# Patient Record
Sex: Female | Born: 1950 | Race: Black or African American | Hispanic: No | Smoking: Never smoker
Health system: Southern US, Community
[De-identification: ages and names within clinical notes are randomized; demographics above are authoritative.]

## PROBLEM LIST (undated history)

## (undated) DIAGNOSIS — T7840XA Allergy, unspecified, initial encounter: Secondary | ICD-10-CM

## (undated) DIAGNOSIS — M109 Gout, unspecified: Secondary | ICD-10-CM

## (undated) DIAGNOSIS — K7689 Other specified diseases of liver: Secondary | ICD-10-CM

## (undated) DIAGNOSIS — I10 Essential (primary) hypertension: Secondary | ICD-10-CM

## (undated) DIAGNOSIS — D3A00 Benign carcinoid tumor of unspecified site: Secondary | ICD-10-CM

## (undated) DIAGNOSIS — N6009 Solitary cyst of unspecified breast: Secondary | ICD-10-CM

## (undated) DIAGNOSIS — E785 Hyperlipidemia, unspecified: Secondary | ICD-10-CM

## (undated) DIAGNOSIS — M1990 Unspecified osteoarthritis, unspecified site: Secondary | ICD-10-CM

## (undated) DIAGNOSIS — C801 Malignant (primary) neoplasm, unspecified: Secondary | ICD-10-CM

## (undated) DIAGNOSIS — N281 Cyst of kidney, acquired: Secondary | ICD-10-CM

## (undated) HISTORY — PX: PARATHYROIDECTOMY: SHX19

## (undated) HISTORY — DX: Hyperlipidemia, unspecified: E78.5

## (undated) HISTORY — DX: Allergy, unspecified, initial encounter: T78.40XA

## (undated) HISTORY — DX: Malignant (primary) neoplasm, unspecified: C80.1

## (undated) HISTORY — DX: Cyst of kidney, acquired: N28.1

## (undated) HISTORY — PX: APPENDECTOMY: SHX54

## (undated) HISTORY — DX: Essential (primary) hypertension: I10

## (undated) HISTORY — PX: TUBAL LIGATION: SHX77

## (undated) HISTORY — PX: ABDOMINAL HYSTERECTOMY: SHX81

## (undated) HISTORY — DX: Unspecified osteoarthritis, unspecified site: M19.90

## (undated) HISTORY — DX: Gout, unspecified: M10.9

## (undated) HISTORY — DX: Other specified diseases of liver: K76.89

## (undated) HISTORY — DX: Benign carcinoid tumor of unspecified site: D3A.00

## (undated) HISTORY — PX: POLYPECTOMY: SHX149

---

## 2008-08-05 ENCOUNTER — Encounter: Admission: RE | Admit: 2008-08-05 | Discharge: 2008-08-05 | Payer: Self-pay | Admitting: Family Medicine

## 2008-08-10 ENCOUNTER — Encounter: Admission: RE | Admit: 2008-08-10 | Discharge: 2008-08-10 | Payer: Self-pay | Admitting: Family Medicine

## 2008-09-10 ENCOUNTER — Encounter: Admission: RE | Admit: 2008-09-10 | Discharge: 2008-09-10 | Payer: Self-pay | Admitting: Gastroenterology

## 2009-02-22 ENCOUNTER — Encounter: Admission: RE | Admit: 2009-02-22 | Discharge: 2009-02-22 | Payer: Self-pay | Admitting: Family Medicine

## 2009-03-15 ENCOUNTER — Encounter: Admission: RE | Admit: 2009-03-15 | Discharge: 2009-03-15 | Payer: Self-pay | Admitting: Gastroenterology

## 2009-08-22 ENCOUNTER — Encounter: Admission: RE | Admit: 2009-08-22 | Discharge: 2009-08-22 | Payer: Self-pay | Admitting: Family Medicine

## 2010-08-08 ENCOUNTER — Other Ambulatory Visit: Payer: Self-pay | Admitting: Family Medicine

## 2010-08-08 DIAGNOSIS — Z1231 Encounter for screening mammogram for malignant neoplasm of breast: Secondary | ICD-10-CM

## 2010-08-24 ENCOUNTER — Ambulatory Visit
Admission: RE | Admit: 2010-08-24 | Discharge: 2010-08-24 | Disposition: A | Discharge status: Home / Self Care | Payer: PRIVATE HEALTH INSURANCE | Source: Ambulatory Visit | Attending: *Deleted | Admitting: *Deleted

## 2010-08-24 DIAGNOSIS — Z1231 Encounter for screening mammogram for malignant neoplasm of breast: Secondary | ICD-10-CM

## 2011-02-16 ENCOUNTER — Encounter: Payer: Self-pay | Admitting: Internal Medicine

## 2011-03-07 ENCOUNTER — Ambulatory Visit (AMBULATORY_SURGERY_CENTER): Payer: PRIVATE HEALTH INSURANCE | Admitting: *Deleted

## 2011-03-07 VITALS — Ht 63.0 in | Wt 221.8 lb

## 2011-03-07 DIAGNOSIS — Z1211 Encounter for screening for malignant neoplasm of colon: Secondary | ICD-10-CM

## 2011-03-07 MED ORDER — MOVIPREP 100 G PO SOLR: ORAL | Status: DC

## 2011-03-19 HISTORY — PX: COLONOSCOPY: SHX174

## 2011-03-21 ENCOUNTER — Encounter: Payer: Self-pay | Admitting: Internal Medicine

## 2011-03-21 ENCOUNTER — Ambulatory Visit (AMBULATORY_SURGERY_CENTER): Payer: PRIVATE HEALTH INSURANCE | Admitting: Internal Medicine

## 2011-03-21 VITALS — BP 142/82 | HR 91 | Temp 97.1°F | Resp 18 | Ht 63.0 in | Wt 221.0 lb

## 2011-03-21 DIAGNOSIS — Z1211 Encounter for screening for malignant neoplasm of colon: Secondary | ICD-10-CM

## 2011-03-21 DIAGNOSIS — D128 Benign neoplasm of rectum: Secondary | ICD-10-CM

## 2011-03-21 DIAGNOSIS — D126 Benign neoplasm of colon, unspecified: Secondary | ICD-10-CM

## 2011-03-21 DIAGNOSIS — K635 Polyp of colon: Secondary | ICD-10-CM

## 2011-03-21 DIAGNOSIS — Z85048 Personal history of other malignant neoplasm of rectum, rectosigmoid junction, and anus: Secondary | ICD-10-CM

## 2011-03-21 DIAGNOSIS — Z86010 Personal history of colonic polyps: Secondary | ICD-10-CM

## 2011-03-21 DIAGNOSIS — D129 Benign neoplasm of anus and anal canal: Secondary | ICD-10-CM

## 2011-03-21 MED ORDER — SODIUM CHLORIDE 0.9 % IV SOLN: 500.0000 mL | INTRAVENOUS | Status: DC

## 2011-03-21 NOTE — Patient Instructions (Signed)

## 2011-03-22 ENCOUNTER — Telehealth: Payer: Self-pay | Admitting: *Deleted

## 2011-03-22 NOTE — Telephone Encounter (Signed)
No id on voicemail no mess left.

## 2011-03-26 ENCOUNTER — Encounter: Payer: Self-pay | Admitting: Internal Medicine

## 2011-04-27 ENCOUNTER — Emergency Department (HOSPITAL_COMMUNITY)
Admission: EM | Admit: 2011-04-27 | Discharge: 2011-04-28 | Disposition: A | Discharge status: Home / Self Care | Payer: PRIVATE HEALTH INSURANCE | Attending: Emergency Medicine | Admitting: Emergency Medicine

## 2011-04-27 ENCOUNTER — Encounter (HOSPITAL_COMMUNITY): Payer: Self-pay | Admitting: *Deleted

## 2011-04-27 ENCOUNTER — Other Ambulatory Visit: Payer: Self-pay

## 2011-04-27 DIAGNOSIS — M25519 Pain in unspecified shoulder: Secondary | ICD-10-CM | POA: Insufficient documentation

## 2011-04-27 DIAGNOSIS — I10 Essential (primary) hypertension: Secondary | ICD-10-CM | POA: Insufficient documentation

## 2011-04-27 DIAGNOSIS — R079 Chest pain, unspecified: Secondary | ICD-10-CM | POA: Insufficient documentation

## 2011-04-27 DIAGNOSIS — E785 Hyperlipidemia, unspecified: Secondary | ICD-10-CM | POA: Insufficient documentation

## 2011-04-27 HISTORY — DX: Solitary cyst of unspecified breast: N60.09

## 2011-04-27 MED ORDER — ASPIRIN 81 MG PO CHEW
81.0000 mg | CHEWABLE_TABLET | Freq: Once | ORAL | Status: AC
  Administered 2011-04-28: 81 mg via ORAL
  Filled 2011-04-27: qty 1

## 2011-04-27 NOTE — ED Notes (Signed)
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 (Denies: nvd, fever, cough, congestion, sob, cold sx or other sx), "just tired & pain in L shoulder).

## 2011-04-27 NOTE — ED Provider Notes (Signed)
History     CSN: [id number] Arrival date & time: 04/27/2011  7:49 PM   First MD Initiated Contact with Patient 04/27/11 2334      Chief Complaint  Patient presents with  . Shoulder Pain   HPI History provided by the patient. Patient has significant history for hypertension and elevated cholesterol levels. Patient presents with complaints of left anterior shoulder and chest discomfort for the past week. Patient also has history of bilateral shoulder tendinitis. She thought symptoms were initially similar but now does not feel like there is pain in the joint and that the pain is outside the joint. She states that symptoms seem to be the worst during the day while at work sitting at her computer. Pain is not made worse by movements. She has been taking Advil throughout the day, which was initially giving relief of symptoms but today she has had no improvement. Pain is a dull sometimes throbbing pain in is occasionally associated with pressure on the tops of bilateral shoulders. She denies shortness of breath, pleuritic chest pain, heart palpitations, nausea, or diaphoresis.    Past Medical History  Diagnosis Date  . Hypertension   . 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 Hyperlipidemia   . Kidney cysts   . Liver cyst   . Cyst (solitary) of breast     L breast cyst, mamogram in September "unchanged"    Past Surgical History  Procedure Date  . Abdominal hysterectomy     1993  . Colonoscopy   . Polypectomy   . Tubal ligation     1980  . Appendectomy     1980  . Parathyroidectomy     2005  . Parathyroidectomy     2006    Family History  Problem Relation Age of Onset  . Asthma Mother   . Hypertension Father   . Multiple myeloma Sister     History  Substance Use Topics  . Smoking status: Never Smoker   . Smokeless tobacco: Never Used  . Alcohol Use: No    OB History    Grav Para Term Preterm Abortions TAB SAB Ect Mult Living                  Review of Systems    Constitutional: Negative for fever and chills.  Respiratory: Negative for cough, shortness of breath and wheezing.   Cardiovascular: Positive for chest pain. Negative for palpitations.  Gastrointestinal: Negative for nausea, vomiting, abdominal pain, diarrhea and constipation.  Neurological: Negative for dizziness, weakness, numbness and headaches.  All other systems reviewed and are negative.    Allergies  Review of patient's allergies indicates no known allergies.  Home Medications   Current Outpatient Rx  Name Route Sig Dispense Refill  . ALLOPURINOL 300 MG PO TABS Oral Take 150 mg by mouth daily.     . ATORVASTATIN CALCIUM 20 MG PO TABS Oral Take 20 mg by mouth daily.     Marland Kitchen VITAMIN D 1000 UNITS PO TABS Oral Take 1,000 Units by mouth daily.     Marland Kitchen COENZYME Q10 30 MG PO CAPS Oral Take 30 mg by mouth daily.      . OMEGA-3 FATTY ACIDS 1000 MG PO CAPS Oral Take 2 g by mouth daily.     . MULTI-VITAMIN/MINERALS PO TABS Oral Take 1 tablet by mouth daily.      Marland Kitchen NAPROXEN SODIUM 220 MG PO TABS Oral Take 220 mg by mouth daily as needed. For pain     .  TRIBENZOR 40-5-25 MG PO TABS Per post-pyloric tube 1 tablet by Per post-pyloric tube route daily.       BP 168/87  Pulse 107  Temp(Src) 97.8 F (36.6 C) (Oral)  Resp 15  SpO2 98%  Physical Exam  Nursing note and vitals reviewed. Constitutional: She is oriented to person, place, and time. She appears well-developed and well-nourished. No distress.  HENT:  Head: Normocephalic.  Eyes: Conjunctivae are normal. No scleral icterus.  Neck: Normal range of motion. Neck supple.  Cardiovascular: Normal rate, regular rhythm and normal heart sounds.  Exam reveals no gallop.   No murmur heard. Pulmonary/Chest: Effort normal and breath sounds normal. She has no wheezes. She has no rales. She exhibits no tenderness.  Abdominal: Soft. There is no tenderness. There is no rebound and no guarding.  Musculoskeletal: Normal range of motion. She  exhibits no edema and no tenderness.       No abnormalities of left shoulder and upper arm. Normal pulses and sensations.  Neurological: She is alert and oriented to person, place, and time.  Skin: Skin is warm. No rash noted. No erythema.  Psychiatric: She has a normal mood and affect.    ED Course  Procedures (including critical care time)  Labs Reviewed  CBC - Abnormal; Notable for the following:    RBC 5.42 (*)    MCV 77.9 (*)    RDW 16.4 (*)    All other components within normal limits  BASIC METABOLIC PANEL - Abnormal; Notable for the following:    Glucose, Bld 118 (*)    Calcium 10.6 (*)    GFR calc non Af Amer 60 (*)    GFR calc Af Amer 69 (*)    All other components within normal limits  CK TOTAL AND CKMB - Abnormal; Notable for the following:    Total CK 207 (*)    CK, MB 4.3 (*)    All other components within normal limits  DIFFERENTIAL  TROPONIN I   Results for orders placed during the hospital encounter of 04/27/11  CBC      Component Value Range   WBC 8.2  4.0 - 10.5 (K/uL)   RBC 5.42 (*) 3.87 - 5.11 (MIL/uL)   Hemoglobin 14.1  12.0 - 15.0 (g/dL)   HCT 16.1  09.6 - 04.5 (%)   MCV 77.9 (*) 78.0 - 100.0 (fL)   MCH 26.0  26.0 - 34.0 (pg)   MCHC 33.4  30.0 - 36.0 (g/dL)   RDW 40.9 (*) 81.1 - 15.5 (%)   Platelets 272  150 - 400 (K/uL)  DIFFERENTIAL      Component Value Range   Neutrophils Relative 70  43 - 77 (%)   Neutro Abs 5.7  1.7 - 7.7 (K/uL)   Lymphocytes Relative 21  12 - 46 (%)   Lymphs Abs 1.7  0.7 - 4.0 (K/uL)   Monocytes Relative 7  3 - 12 (%)   Monocytes Absolute 0.6  0.1 - 1.0 (K/uL)   Eosinophils Relative 1  0 - 5 (%)   Eosinophils Absolute 0.1  0.0 - 0.7 (K/uL)   Basophils Relative 1  0 - 1 (%)   Basophils Absolute 0.0  0.0 - 0.1 (K/uL)  BASIC METABOLIC PANEL      Component Value Range   Sodium 141  135 - 145 (mEq/L)   Potassium 3.5  3.5 - 5.1 (mEq/L)   Chloride 99  96 - 112 (mEq/L)   CO2 29  19 -  32 (mEq/L)   Glucose, Bld 118 (*) 70  - 99 (mg/dL)   BUN 17  6 - 23 (mg/dL)   Creatinine, Ser 9.62  0.50 - 1.10 (mg/dL)   Calcium 95.2 (*) 8.4 - 10.5 (mg/dL)   GFR calc non Af Amer 60 (*) >90 (mL/min)   GFR calc Af Amer 69 (*) >90 (mL/min)  TROPONIN I      Component Value Range   Troponin I <0.30  <0.30 (ng/mL)  CK TOTAL AND CKMB      Component Value Range   Total CK 207 (*) 7 - 177 (U/L)   CK, MB 4.3 (*) 0.3 - 4.0 (ng/mL)   Relative Index 2.1  0.0 - 2.5   TROPONIN I      Component Value Range   Troponin I <0.30  <0.30 (ng/mL)  CK TOTAL AND CKMB      Component Value Range   Total CK 196 (*) 7 - 177 (U/L)   CK, MB 4.3 (*) 0.3 - 4.0 (ng/mL)   Relative Index 2.2  0.0 - 2.5   POCT I-STAT, CHEM 8      Component Value Range   Sodium 141  135 - 145 (mEq/L)   Potassium 3.2 (*) 3.5 - 5.1 (mEq/L)   Chloride 101  96 - 112 (mEq/L)   BUN 18  6 - 23 (mg/dL)   Creatinine, Ser 8.41  0.50 - 1.10 (mg/dL)   Glucose, Bld 324 (*) 70 - 99 (mg/dL)   Calcium, Ion 4.01  0.27 - 1.32 (mmol/L)   TCO2 29  0 - 100 (mmol/L)   Hemoglobin 15.3 (*) 12.0 - 15.0 (g/dL)   HCT 25.3  66.4 - 40.3 (%)  POCT I-STAT TROPONIN I      Component Value Range   Troponin i, poc 0.01  0.00 - 0.08 (ng/mL)   Comment 3           POCT I-STAT TROPONIN I      Component Value Range   Troponin i, poc 0.00  0.00 - 0.08 (ng/mL)   Comment 3              Dg Chest 2 View  04/28/2011  *RADIOLOGY REPORT*  Clinical Data: Chest and left shoulder pain and throbbing; lethargy.  CHEST - 2 VIEW  Comparison: None.  Findings: The lungs are well-aerated and clear.  There is no evidence of focal opacification, pleural effusion or pneumothorax.  The heart is normal in size; the mediastinal contour is within normal limits.  No acute osseous abnormalities are seen.  There is mild narrowing at the glenohumeral joints bilaterally, without significant associated degenerative change.  IMPRESSION: No acute cardiopulmonary process seen.  Original Report Authenticated By: Tonia Ghent, M.D.      1. Chest pain     MDM  Patient seen and evaluated. Patient in no acute distress.  Patient discussed with attending physician. Labs reviewed. Patient has slight elevation of CK-MB though total CK also elevated. Will get a three-hour checked to compare for any trend. If levels the same or decreased and no change in troponin patient felt safe to return home with low suspicion for a serious at this time.  Patient without any change in cardiac markers no elevated troponin. At this time patient and family ready to return home. Patient will followup with PCP on Monday.   ECG  Date: 04/28/2011  Rate: 95  Rhythm: normal sinus rhythm and premature ventricular contractions (PVC)  QRS Axis: normal  Intervals:  normal  ST/T Wave abnormalities: normal  Conduction Disutrbances:none  Narrative Interpretation:   Old EKG Reviewed: none available    Angus Seller, [place] 04/28/11 [phone]

## 2011-04-28 ENCOUNTER — Emergency Department (HOSPITAL_COMMUNITY): Payer: PRIVATE HEALTH INSURANCE

## 2011-04-28 LAB — POCT I-STAT, CHEM 8
BUN: 18 mg/dL (ref 6–23)
HCT: 45 % (ref 36.0–46.0)
Sodium: 141 mEq/L (ref 135–145)
TCO2: 29 mmol/L (ref 0–100)

## 2011-04-28 LAB — CK TOTAL AND CKMB (NOT AT ARMC)
CK, MB: 4.3 ng/mL — ABNORMAL HIGH (ref 0.3–4.0)
Relative Index: 2.2 (ref 0.0–2.5)

## 2011-04-28 LAB — CBC
HCT: 42.2 % (ref 36.0–46.0)
MCH: 26 pg (ref 26.0–34.0)
MCHC: 33.4 g/dL (ref 30.0–36.0)
MCV: 77.9 fL — ABNORMAL LOW (ref 78.0–100.0)
RDW: 16.4 % — ABNORMAL HIGH (ref 11.5–15.5)

## 2011-04-28 LAB — TROPONIN I
Troponin I: <0.3 ng/mL (ref <0.30)
Troponin I: <0.3 ng/mL (ref <0.30)

## 2011-04-28 LAB — DIFFERENTIAL
Basophils Absolute: 0 10*3/uL (ref 0.0–0.1)
Basophils Relative: 1 % (ref 0–1)
Eosinophils Relative: 1 % (ref 0–5)
Monocytes Absolute: 0.6 10*3/uL (ref 0.1–1.0)

## 2011-04-28 LAB — POCT I-STAT TROPONIN I: Troponin i, poc: 0.01 ng/mL (ref 0.00–0.08)

## 2011-04-28 LAB — BASIC METABOLIC PANEL
BUN: 17 mg/dL (ref 6–23)
CO2: 29 mEq/L (ref 19–32)
Calcium: 10.6 mg/dL — ABNORMAL HIGH (ref 8.4–10.5)
Creatinine, Ser: 1 mg/dL (ref 0.50–1.10)
GFR calc Af Amer: 69 mL/min — ABNORMAL LOW (ref >90)

## 2011-04-28 NOTE — ED Provider Notes (Signed)
Medical screening examination/treatment/procedure(s) were performed by non-physician practitioner and as supervising physician I was immediately available for consultation/collaboration.  Eugenio Dollins, MD 04/28/11 0747 

## 2011-04-28 NOTE — ED Notes (Signed)
Pt st's she has been having pain in left shoulder area for approx 5 days.  Also st's at times she feels pressure between shoulder blades.  Pt denies shortness of breath, nausea or diaphoresis.  Family at bedside.

## 2011-08-28 ENCOUNTER — Other Ambulatory Visit: Payer: Self-pay | Admitting: Family Medicine

## 2011-08-28 DIAGNOSIS — Z1231 Encounter for screening mammogram for malignant neoplasm of breast: Secondary | ICD-10-CM

## 2011-09-10 ENCOUNTER — Ambulatory Visit
Admission: RE | Admit: 2011-09-10 | Discharge: 2011-09-10 | Disposition: A | Discharge status: Home / Self Care | Payer: PRIVATE HEALTH INSURANCE | Source: Ambulatory Visit | Attending: *Deleted | Admitting: *Deleted

## 2011-09-10 DIAGNOSIS — Z1231 Encounter for screening mammogram for malignant neoplasm of breast: Secondary | ICD-10-CM

## 2012-09-04 ENCOUNTER — Other Ambulatory Visit: Payer: Self-pay

## 2012-09-04 DIAGNOSIS — Z1231 Encounter for screening mammogram for malignant neoplasm of breast: Secondary | ICD-10-CM

## 2012-10-10 ENCOUNTER — Ambulatory Visit
Admission: RE | Admit: 2012-10-10 | Discharge: 2012-10-10 | Disposition: A | Discharge status: Home / Self Care | Payer: PRIVATE HEALTH INSURANCE | Source: Ambulatory Visit

## 2012-10-10 DIAGNOSIS — Z1231 Encounter for screening mammogram for malignant neoplasm of breast: Secondary | ICD-10-CM

## 2013-09-25 ENCOUNTER — Other Ambulatory Visit: Payer: Self-pay

## 2013-09-25 DIAGNOSIS — Z1231 Encounter for screening mammogram for malignant neoplasm of breast: Secondary | ICD-10-CM

## 2013-10-12 ENCOUNTER — Encounter (INDEPENDENT_AMBULATORY_CARE_PROVIDER_SITE_OTHER): Payer: Self-pay

## 2013-10-12 ENCOUNTER — Ambulatory Visit
Admission: RE | Admit: 2013-10-12 | Discharge: 2013-10-12 | Disposition: A | Discharge status: Home / Self Care | Payer: PRIVATE HEALTH INSURANCE | Source: Ambulatory Visit

## 2013-10-12 DIAGNOSIS — Z1231 Encounter for screening mammogram for malignant neoplasm of breast: Secondary | ICD-10-CM

## 2014-08-13 DIAGNOSIS — Z0000 Encounter for general adult medical examination without abnormal findings: Secondary | ICD-10-CM | POA: Diagnosis not present

## 2014-08-16 DIAGNOSIS — Z23 Encounter for immunization: Secondary | ICD-10-CM | POA: Diagnosis not present

## 2014-09-21 ENCOUNTER — Other Ambulatory Visit: Payer: Self-pay

## 2014-09-21 DIAGNOSIS — Z1231 Encounter for screening mammogram for malignant neoplasm of breast: Secondary | ICD-10-CM

## 2014-10-22 ENCOUNTER — Ambulatory Visit
Admission: RE | Admit: 2014-10-22 | Discharge: 2014-10-22 | Disposition: A | Discharge status: Home / Self Care | Payer: Medicare Other | Source: Ambulatory Visit

## 2014-10-22 DIAGNOSIS — Z1231 Encounter for screening mammogram for malignant neoplasm of breast: Secondary | ICD-10-CM | POA: Diagnosis not present

## 2015-04-29 DIAGNOSIS — J0100 Acute maxillary sinusitis, unspecified: Secondary | ICD-10-CM | POA: Diagnosis not present

## 2015-04-29 DIAGNOSIS — M7711 Lateral epicondylitis, right elbow: Secondary | ICD-10-CM | POA: Diagnosis not present

## 2015-07-28 DIAGNOSIS — N3000 Acute cystitis without hematuria: Secondary | ICD-10-CM | POA: Diagnosis not present

## 2015-08-31 DIAGNOSIS — H524 Presbyopia: Secondary | ICD-10-CM | POA: Diagnosis not present

## 2015-08-31 DIAGNOSIS — H2513 Age-related nuclear cataract, bilateral: Secondary | ICD-10-CM | POA: Diagnosis not present

## 2015-08-31 DIAGNOSIS — H5203 Hypermetropia, bilateral: Secondary | ICD-10-CM | POA: Diagnosis not present

## 2015-08-31 DIAGNOSIS — H52223 Regular astigmatism, bilateral: Secondary | ICD-10-CM | POA: Diagnosis not present

## 2015-09-21 DIAGNOSIS — Z79899 Other long term (current) drug therapy: Secondary | ICD-10-CM | POA: Diagnosis not present

## 2015-09-21 DIAGNOSIS — I10 Essential (primary) hypertension: Secondary | ICD-10-CM | POA: Diagnosis not present

## 2015-09-21 DIAGNOSIS — M109 Gout, unspecified: Secondary | ICD-10-CM | POA: Diagnosis not present

## 2015-09-21 DIAGNOSIS — E782 Mixed hyperlipidemia: Secondary | ICD-10-CM | POA: Diagnosis not present

## 2015-09-21 DIAGNOSIS — Z0000 Encounter for general adult medical examination without abnormal findings: Secondary | ICD-10-CM | POA: Diagnosis not present

## 2015-09-21 DIAGNOSIS — Z23 Encounter for immunization: Secondary | ICD-10-CM | POA: Diagnosis not present

## 2015-09-22 DIAGNOSIS — I720 Aneurysm of carotid artery: Secondary | ICD-10-CM | POA: Diagnosis not present

## 2015-10-06 ENCOUNTER — Other Ambulatory Visit: Payer: Self-pay

## 2015-10-06 DIAGNOSIS — Z1231 Encounter for screening mammogram for malignant neoplasm of breast: Secondary | ICD-10-CM

## 2015-10-31 ENCOUNTER — Ambulatory Visit
Admission: RE | Admit: 2015-10-31 | Discharge: 2015-10-31 | Disposition: A | Discharge status: Home / Self Care | Payer: Medicare Other | Source: Ambulatory Visit

## 2015-10-31 DIAGNOSIS — Z1231 Encounter for screening mammogram for malignant neoplasm of breast: Secondary | ICD-10-CM | POA: Diagnosis not present

## 2016-02-06 ENCOUNTER — Encounter: Payer: Self-pay | Admitting: Gastroenterology

## 2016-04-05 ENCOUNTER — Encounter: Payer: Self-pay | Admitting: Gastroenterology

## 2016-04-27 ENCOUNTER — Ambulatory Visit: Payer: Medicare Other | Admitting: *Deleted

## 2016-04-27 VITALS — Ht 62.0 in | Wt 216.0 lb

## 2016-04-27 DIAGNOSIS — Z86010 Personal history of colonic polyps: Secondary | ICD-10-CM

## 2016-04-27 MED ORDER — SUPREP BOWEL PREP KIT 17.5-3.13-1.6 GM/177ML PO SOLN: 1.0000 | Freq: Once | ORAL | 0 refills | Status: AC

## 2016-04-27 NOTE — Progress Notes (Signed)
Patient denies any allergies to egg or soy products. Patient denies complications with anesthesia/sedation.  Patient denies oxygen use at home and denies diet medications. Emmi instructions for colonoscopy explained but patient denied.     

## 2016-05-15 ENCOUNTER — Ambulatory Visit (AMBULATORY_SURGERY_CENTER): Payer: Medicare Other | Admitting: Gastroenterology

## 2016-05-15 ENCOUNTER — Encounter: Payer: Self-pay | Admitting: Gastroenterology

## 2016-05-15 VITALS — BP 145/77 | HR 81 | Temp 97.8°F | Resp 12 | Ht 62.0 in | Wt 216.0 lb

## 2016-05-15 DIAGNOSIS — Z86010 Personal history of colonic polyps: Secondary | ICD-10-CM | POA: Diagnosis not present

## 2016-05-15 DIAGNOSIS — I10 Essential (primary) hypertension: Secondary | ICD-10-CM | POA: Diagnosis not present

## 2016-05-15 MED ORDER — SODIUM CHLORIDE 0.9 % IV SOLN: 500.0000 mL | INTRAVENOUS | Status: DC

## 2016-05-15 NOTE — Progress Notes (Signed)
Report to PACU, RN, vss, BBS= Clear.  

## 2016-05-15 NOTE — Patient Instructions (Signed)
YOU HAD AN ENDOSCOPIC PROCEDURE TODAY AT [facility]:   Refer to the procedure report that was given to you for any specific questions about what was found during the examination.  If the procedure report does not answer your questions, please call your gastroenterologist to clarify.  If you requested that your care partner not be given the details of your procedure findings, then the procedure report has been included in a sealed envelope for you to review at your convenience later.  YOU SHOULD EXPECT: Some feelings of bloating in the abdomen. Passage of more gas than usual.  Walking can help get rid of the air that was put into your GI tract during the procedure and reduce the bloating. If you had a lower endoscopy (such as a colonoscopy or flexible sigmoidoscopy) you may notice spotting of blood in your stool or on the toilet paper. If you underwent a bowel prep for your procedure, you may not have a normal bowel movement for a few days.  Please Note:  You might notice some irritation and congestion in your nose or some drainage.  This is from the oxygen used during your procedure.  There is no need for concern and it should clear up in a day or so.  SYMPTOMS TO REPORT IMMEDIATELY:   Following lower endoscopy (colonoscopy or flexible sigmoidoscopy):  Excessive amounts of blood in the stool  Significant tenderness or worsening of abdominal pains  Swelling of the abdomen that is new, acute  Fever of 100F or higher   Following upper endoscopy (EGD)  Vomiting of blood or coffee ground material  New chest pain or pain under the shoulder blades  Painful or persistently difficult swallowing  New shortness of breath  Fever of 100F or higher  Black, tarry-looking stools  For urgent or emergent issues, a gastroenterologist can be reached at any hour by calling [phone].   DIET:  We do recommend a small meal at first, but then you may proceed to your regular diet.  Drink  plenty of fluids but you should avoid alcoholic beverages for 24 hours.  ACTIVITY:  You should plan to take it easy for the rest of today and you should NOT DRIVE or use heavy machinery until tomorrow (because of the sedation medicines used during the test).    FOLLOW UP: Our staff will call the number listed on your records the next business day following your procedure to check on you and address any questions or concerns that you may have regarding the information given to you following your procedure. If we do not reach you, we will leave a message.  However, if you are feeling well and you are not experiencing any problems, there is no need to return our call.  We will assume that you have returned to your regular daily activities without incident.  If any biopsies were taken you will be contacted by phone or by letter within the next 1-3 weeks.  Please call us at [phone] if you have not heard about the biopsies in 3 weeks.    SIGNATURES/CONFIDENTIALITY: You and/or your care partner have signed paperwork which will be entered into your electronic medical record.  These signatures attest to the fact that that the information above on your After Visit Summary has been reviewed and is understood.  Full responsibility of the confidentiality of this discharge information lies with you and/or your care-partner.  Next colonoscopy 10 years-2027.

## 2016-05-15 NOTE — Op Note (Signed)
[facility] Patient Name: Kristen Wolf Procedure Date: 05/15/2016 1:08 PM MRN: [id number] Endoscopist: Mauri Pole , MD Age: 65 Referring MD:  Date of Birth: 1951/01/28 Gender: Female Account #: [id number] Procedure:                Colonoscopy Indications:              High risk colon cancer surveillance: Personal                            history of colonic polyps, Surveillance: Personal                            history of colonic polyps (unknown histology) on                            last colonoscopy 5 years ago Medicines:                Monitored Anesthesia Care Procedure:                Pre-Anesthesia Assessment:                           - Prior to the procedure, a History and Physical                            was performed, and patient medications and                            allergies were reviewed. The patient's tolerance of                            previous anesthesia was also reviewed. The risks                            and benefits of the procedure and the sedation                            options and risks were discussed with the patient.                            All questions were answered, and informed consent                            was obtained. Prior Anticoagulants: The patient has                            taken no previous anticoagulant or antiplatelet                            agents. ASA Grade Assessment: II - A patient with                            mild systemic disease. After reviewing the risks  and benefits, the patient was deemed in                            satisfactory condition to undergo the procedure.                           After obtaining informed consent, the colonoscope                            was passed under direct vision. Throughout the                            procedure, the patient's blood pressure, pulse, and                            oxygen saturations were monitored  continuously. The                            Model CF-HQ190L [id number] scope was introduced                            through the anus and advanced to the the terminal                            ileum, with identification of the appendiceal                            orifice and IC valve. The colonoscopy was performed                            without difficulty. The patient tolerated the                            procedure well. The quality of the bowel                            preparation was excellent. The terminal ileum,                            ileocecal valve, appendiceal orifice, and rectum                            were photographed. Scope In: 1:18:23 PM Scope Out: 1:33:03 PM Scope Withdrawal Time: 0 hours 8 minutes 36 seconds  Total Procedure Duration: 0 hours 14 minutes 40 seconds  Findings:                 The perianal and digital rectal examinations were                            normal.                           A tattoo was seen in the rectum. A post-polypectomy  scar was found at the tattoo site.                           The exam was otherwise without abnormality. Complications:            No immediate complications. Estimated Blood Loss:     Estimated blood loss: none. Impression:               - A tattoo was seen in the rectum. A                            post-polypectomy scar was found at the tattoo site.                           - The examination was otherwise normal.                           - No specimens collected. Recommendation:           - Patient has a contact number available for                            emergencies. The signs and symptoms of potential                            delayed complications were discussed with the                            patient. Return to normal activities tomorrow.                            Written discharge instructions were provided to the                            patient.                            - Resume previous diet.                           - Continue present medications.                           - Repeat colonoscopy in 10 years for surveillance.                           - Return to GI clinic PRN. Mauri Pole, MD 05/15/2016 1:39:31 PM This report has been signed electronically.

## 2016-10-08 ENCOUNTER — Other Ambulatory Visit: Payer: Self-pay | Admitting: Family Medicine

## 2016-10-08 DIAGNOSIS — Z1231 Encounter for screening mammogram for malignant neoplasm of breast: Secondary | ICD-10-CM

## 2016-11-01 ENCOUNTER — Ambulatory Visit
Admission: RE | Admit: 2016-11-01 | Discharge: 2016-11-01 | Disposition: A | Discharge status: Home / Self Care | Payer: Medicare Other | Source: Ambulatory Visit | Attending: Family Medicine | Admitting: Family Medicine

## 2016-11-01 DIAGNOSIS — Z1231 Encounter for screening mammogram for malignant neoplasm of breast: Secondary | ICD-10-CM | POA: Diagnosis not present

## 2016-11-05 DIAGNOSIS — M109 Gout, unspecified: Secondary | ICD-10-CM | POA: Diagnosis not present

## 2016-11-05 DIAGNOSIS — I10 Essential (primary) hypertension: Secondary | ICD-10-CM | POA: Diagnosis not present

## 2016-11-05 DIAGNOSIS — Z79899 Other long term (current) drug therapy: Secondary | ICD-10-CM | POA: Diagnosis not present

## 2016-11-05 DIAGNOSIS — E782 Mixed hyperlipidemia: Secondary | ICD-10-CM | POA: Diagnosis not present

## 2016-11-05 DIAGNOSIS — I720 Aneurysm of carotid artery: Secondary | ICD-10-CM | POA: Diagnosis not present

## 2016-11-05 DIAGNOSIS — Z0000 Encounter for general adult medical examination without abnormal findings: Secondary | ICD-10-CM | POA: Diagnosis not present

## 2016-11-05 DIAGNOSIS — K219 Gastro-esophageal reflux disease without esophagitis: Secondary | ICD-10-CM | POA: Diagnosis not present

## 2016-11-05 DIAGNOSIS — E049 Nontoxic goiter, unspecified: Secondary | ICD-10-CM | POA: Diagnosis not present

## 2016-11-05 DIAGNOSIS — Z85040 Personal history of malignant carcinoid tumor of rectum: Secondary | ICD-10-CM | POA: Diagnosis not present

## 2016-12-12 DIAGNOSIS — R55 Syncope and collapse: Secondary | ICD-10-CM | POA: Diagnosis not present

## 2016-12-12 DIAGNOSIS — I951 Orthostatic hypotension: Secondary | ICD-10-CM | POA: Diagnosis not present

## 2016-12-12 DIAGNOSIS — J0100 Acute maxillary sinusitis, unspecified: Secondary | ICD-10-CM | POA: Diagnosis not present

## 2016-12-12 DIAGNOSIS — Z6836 Body mass index (BMI) 36.0-36.9, adult: Secondary | ICD-10-CM | POA: Diagnosis not present

## 2016-12-24 DIAGNOSIS — R0989 Other specified symptoms and signs involving the circulatory and respiratory systems: Secondary | ICD-10-CM | POA: Diagnosis not present

## 2016-12-24 DIAGNOSIS — Z6835 Body mass index (BMI) 35.0-35.9, adult: Secondary | ICD-10-CM | POA: Diagnosis not present

## 2016-12-24 DIAGNOSIS — I951 Orthostatic hypotension: Secondary | ICD-10-CM | POA: Diagnosis not present

## 2016-12-24 DIAGNOSIS — R55 Syncope and collapse: Secondary | ICD-10-CM | POA: Diagnosis not present

## 2017-01-05 ENCOUNTER — Other Ambulatory Visit: Payer: Self-pay

## 2017-01-05 ENCOUNTER — Emergency Department (HOSPITAL_COMMUNITY): Payer: Medicare Other

## 2017-01-05 ENCOUNTER — Encounter (HOSPITAL_COMMUNITY): Payer: Self-pay | Admitting: Emergency Medicine

## 2017-01-05 ENCOUNTER — Emergency Department (HOSPITAL_COMMUNITY)
Admission: EM | Admit: 2017-01-05 | Discharge: 2017-01-05 | Disposition: A | Discharge status: Home / Self Care | Payer: Medicare Other | Attending: Emergency Medicine | Admitting: Emergency Medicine

## 2017-01-05 DIAGNOSIS — R200 Anesthesia of skin: Secondary | ICD-10-CM | POA: Diagnosis not present

## 2017-01-05 DIAGNOSIS — Z79899 Other long term (current) drug therapy: Secondary | ICD-10-CM | POA: Diagnosis not present

## 2017-01-05 DIAGNOSIS — E876 Hypokalemia: Secondary | ICD-10-CM | POA: Insufficient documentation

## 2017-01-05 DIAGNOSIS — I10 Essential (primary) hypertension: Secondary | ICD-10-CM | POA: Diagnosis not present

## 2017-01-05 DIAGNOSIS — R002 Palpitations: Secondary | ICD-10-CM | POA: Diagnosis not present

## 2017-01-05 DIAGNOSIS — Z85040 Personal history of malignant carcinoid tumor of rectum: Secondary | ICD-10-CM | POA: Diagnosis not present

## 2017-01-05 LAB — CBC
HCT: 42.5 % (ref 36.0–46.0)
HEMOGLOBIN: 14.3 g/dL (ref 12.0–15.0)
MCH: 25.5 pg — ABNORMAL LOW (ref 26.0–34.0)
MCHC: 33.6 g/dL (ref 30.0–36.0)
MCV: 75.8 fL — AB (ref 78.0–100.0)
PLATELETS: 288 10*3/uL (ref 150–400)
RBC: 5.61 MIL/uL — AB (ref 3.87–5.11)
RDW: 16.4 % — AB (ref 11.5–15.5)
WBC: 7.9 10*3/uL (ref 4.0–10.5)

## 2017-01-05 LAB — BASIC METABOLIC PANEL
ANION GAP: 12 (ref 5–15)
BUN: 14 mg/dL (ref 6–20)
CALCIUM: 9.8 mg/dL (ref 8.9–10.3)
CO2: 24 mmol/L (ref 22–32)
Chloride: 97 mmol/L — ABNORMAL LOW (ref 101–111)
Creatinine, Ser: 1.16 mg/dL — ABNORMAL HIGH (ref 0.44–1.00)
GFR, EST AFRICAN AMERICAN: 55 mL/min — AB (ref >60)
GFR, EST NON AFRICAN AMERICAN: 48 mL/min — AB (ref >60)
Glucose, Bld: 113 mg/dL — ABNORMAL HIGH (ref 65–99)
Potassium: 3.2 mmol/L — ABNORMAL LOW (ref 3.5–5.1)
Sodium: 133 mmol/L — ABNORMAL LOW (ref 135–145)

## 2017-01-05 LAB — POCT I-STAT TROPONIN I
Troponin i, poc: 0.01 ng/mL (ref 0.00–0.08)
Troponin i, poc: 0 ng/mL (ref 0.00–0.08)

## 2017-01-05 MED ORDER — POTASSIUM CHLORIDE CRYS ER 20 MEQ PO TBCR: 20.0000 meq | EXTENDED_RELEASE_TABLET | Freq: Every day | ORAL | 0 refills | Status: DC

## 2017-01-05 MED ORDER — POTASSIUM CHLORIDE CRYS ER 20 MEQ PO TBCR
40.0000 meq | EXTENDED_RELEASE_TABLET | Freq: Once | ORAL | Status: AC
  Administered 2017-01-05: 40 meq via ORAL
  Filled 2017-01-05: qty 2

## 2017-01-05 NOTE — ED Notes (Signed)
Patient in xray 

## 2017-01-05 NOTE — ED Provider Notes (Signed)
[facility] DEPT Provider Note: Kristen Spurling, MD, FACEP  CSN: [id number] MRN: [id number] ARRIVAL: 01/05/17 at [place]: [place]  Kristen Wolf is a 66 y.o. female who was placed on Augmentin about 3 weeks ago for a sinus infection. This gave her diarrhea and she stopped the Augmentin after 3 days. The diarrhea persisted for about a week. Along with the diarrhea she developed intermittent palpitations. Specifically she describes a sensation of a rapid heartbeat. Her physician tried her on metoprolol but she did not like the way this made her feel; she states it made her feel "like a drug addict" so she was tapered back off. She continues to have episodic palpitations. She was awakened from sleep sometime after 2 this morning with palpitations, again the sensation of a rapid heartbeat. There was no associated chest pain or shortness of breath but she did feel like the left side of her body was heavy. Her rapid heart rate improved and at the time of arrival her heart rate was 109 which subsequently slowed to normal range. Her EKG showed normal sinus rhythm. She has been asymptomatic since arrival.  She has had some episodic heartburn-like 5/10 chest pain over the past several days but none this morning. These episodes are fleeting. She continues to have sinus congestion as well as a popping sensation in her right ear.   Past Medical History:  Diagnosis Date  . Allergy   . Arthritis    hands  . Cancer (HCC)    hx rectal cancer  . 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 Cyst (solitary) of breast    L breast cyst, mamogram in September "unchanged"  . Gout   . Hyperlipidemia   . Hypertension   . Kidney cysts   . Liver cyst     Past Surgical History:  Procedure Laterality Date  . ABDOMINAL HYSTERECTOMY     1993  . APPENDECTOMY     1980  . COLONOSCOPY  03/2011   Hx rectal cancer  . PARATHYROIDECTOMY     2005  .  PARATHYROIDECTOMY     2006  . POLYPECTOMY    . TUBAL LIGATION     1980  . TUBAL LIGATION      Family History  Problem Relation Age of Onset  . Asthma Mother   . Hypertension Father   . Multiple myeloma Sister   . Colon cancer Neg Hx   . Esophageal cancer Neg Hx   . Stomach cancer Neg Hx     Social History  Substance Use Topics  . Smoking status: Never Smoker  . Smokeless tobacco: Never Used  . Alcohol use No    Prior to Admission medications   Medication Sig Start Date End Date Taking? Authorizing Provider  allopurinol (ZYLOPRIM) 300 MG tablet Take 300 mg by mouth daily.  02/26/11  Yes [provider]  amLODipine-valsartan (EXFORGE) 10-160 MG tablet Take 1 tablet by mouth daily. 11/06/16  Yes [provider]  cholecalciferol (VITAMIN D) 1000 UNITS tablet Take 1,000 Units by mouth daily.    Yes [provider]  co-enzyme Q-10 30 MG capsule Take 30 mg by mouth daily.     Yes [provider]  fluticasone (FLONASE) 50 MCG/ACT nasal spray Place 1 spray into both nostrils daily.   Yes [provider]  hydrochlorothiazide (MICROZIDE) 12.5 MG capsule Take 12.5 mg by mouth every morning.   Yes [provider]  pravastatin (PRAVACHOL) 40 MG tablet Take 40 mg by mouth daily. 11/05/16  Yes [provider]    Allergies Cefdinir   REVIEW OF SYSTEMS  Negative except as noted here or in the History of Present Illness.   PHYSICAL EXAMINATION  Initial Vital Signs Blood pressure (!) 158/98, pulse (!) 109, temperature 97.8 F (36.6 C), temperature source Oral, resp. rate 18, height '5\' 3"'  (1.6 m), weight 89.4 kg (197 lb), SpO2 100 %.  Examination General: Well-developed, well-nourished female in no acute distress; appearance consistent with age of record HENT: normocephalic; atraumatic; TMs normal Eyes: pupils equal, round and reactive to light; extraocular muscles intact Neck: supple Heart: regular rate and rhythm Lungs:  clear to auscultation bilaterally Abdomen: soft; nondistended; nontender; bowel sounds present Extremities: No deformity; full range of motion; pulses normal Neurologic: Awake, alert and oriented; motor function intact in all extremities and symmetric; no facial droop Skin: Warm and dry Psychiatric: Normal mood and affect   RESULTS  Summary of this visit's results, reviewed by myself:   EKG Interpretation  Date/Time:  Saturday January 05 2017 03:46:03 EDT Ventricular Rate:  106 PR Interval:    QRS Duration: 78 QT Interval:  378 QTC Calculation: 502 R Axis:   15 Text Interpretation:  Sinus tachycardia Ventricular premature complex Abnormal R-wave progression, early transition Nonspecific T abnormalities, diffuse leads Prolonged QT interval T-wave abnormalities not seen previously Confirmed by Shanon Rosser [phone]) on 01/05/2017 3:58:27 AM      Laboratory Studies: Results for orders placed or performed during the hospital encounter of 01/05/17 (from the past 24 hour(s))  Basic metabolic panel     Status: Abnormal   Collection Time: 01/05/17  4:04 AM  Result Value Ref Range   Sodium 133 (L) 135 - 145 mmol/L   Potassium 3.2 (L) 3.5 - 5.1 mmol/L   Chloride 97 (L) 101 - 111 mmol/L   CO2 24 22 - 32 mmol/L   Glucose, Bld 113 (H) 65 - 99 mg/dL   BUN 14 6 - 20 mg/dL   Creatinine, Ser 1.16 (H) 0.44 - 1.00 mg/dL   Calcium 9.8 8.9 - 10.3 mg/dL   GFR calc non Af Amer 48 (L) >60 mL/min   GFR calc Af Amer 55 (L) >60 mL/min   Anion gap 12 5 - 15  CBC     Status: Abnormal   Collection Time: 01/05/17  4:04 AM  Result Value Ref Range   WBC 7.9 4.0 - 10.5 K/uL   RBC 5.61 (H) 3.87 - 5.11 MIL/uL   Hemoglobin 14.3 12.0 - 15.0 g/dL   HCT 42.5 36.0 - 46.0 %   MCV 75.8 (L) 78.0 - 100.0 fL   MCH 25.5 (L) 26.0 - 34.0 pg   MCHC 33.6 30.0 - 36.0 g/dL   RDW 16.4 (H) 11.5 - 15.5 %   Platelets 288 150 - 400 K/uL  POCT i-Stat troponin I     Status: None   Collection Time: 01/05/17  4:17 AM  Result  Value Ref Range   Troponin i, poc 0.01 0.00 - 0.08 ng/mL   Comment 3          POCT i-Stat troponin I     Status: None   Collection Time: 01/05/17  6:11 AM  Result Value Ref Range   Troponin i, poc 0.00 0.00 - 0.08 ng/mL   Comment 3           Imaging Studies: Dg Chest 2 View  Result Date: 01/05/2017  CLINICAL DATA:  Left arm pain and numbness EXAM: CHEST  2 VIEW COMPARISON:  Chest radiograph 04/28/2011 FINDINGS: The heart size and mediastinal contours are within normal limits. Both lungs are clear. The visualized skeletal structures are unremarkable. IMPRESSION: No active cardiopulmonary disease. Electronically Signed   By: Ulyses Jarred M.D.   On: 01/05/2017 04:04    ED COURSE  Nursing notes and initial vitals signs, including pulse oximetry, reviewed.  Vitals:   01/05/17 0342 01/05/17 0343 01/05/17 0500  BP: (!) 158/98  (!) 157/95  Pulse: (!) 109  96  Resp: 18  15  Temp: 97.8 F (36.6 C)    TempSrc: Oral    SpO2: 100%  100%  Weight:  89.4 kg (197 lb)   Height:  '5\' 3"'  (1.6 m)    6:03 AM The patient's monitor strip for her time in the ED was reviewed. It showed normal sinus rhythm at a normal rate for the entire monitoring period. One ectopic beat was noted. Patient given 40 milliequivalents of K Dur for mild hypokalemia.  PROCEDURES    ED DIAGNOSES     ICD-10-CM   1. Palpitations R00.2   2. Hypokalemia E87.6        Rithy Mandley, Jenny Reichmann, MD 01/05/17 [phone]

## 2017-01-05 NOTE — ED Triage Notes (Signed)
Patient complaining of left arm pain and numbness. Patient states that her heart is racing and having pain in mid chest. Patient states it woke her out of her sleep 30 Minutes ago.

## 2017-01-05 NOTE — ED Notes (Signed)
EKG given to EDP,Molpus,MD., for review. 

## 2017-01-15 DIAGNOSIS — H5203 Hypermetropia, bilateral: Secondary | ICD-10-CM | POA: Diagnosis not present

## 2017-01-15 DIAGNOSIS — H2513 Age-related nuclear cataract, bilateral: Secondary | ICD-10-CM | POA: Diagnosis not present

## 2017-01-15 DIAGNOSIS — H52222 Regular astigmatism, left eye: Secondary | ICD-10-CM | POA: Diagnosis not present

## 2017-01-15 DIAGNOSIS — H524 Presbyopia: Secondary | ICD-10-CM | POA: Diagnosis not present

## 2017-01-28 ENCOUNTER — Ambulatory Visit (INDEPENDENT_AMBULATORY_CARE_PROVIDER_SITE_OTHER): Payer: Medicare Other | Admitting: Family Medicine

## 2017-01-28 ENCOUNTER — Encounter: Payer: Self-pay | Admitting: Family Medicine

## 2017-01-28 VITALS — BP 160/90 | HR 100 | Resp 12 | Ht 63.0 in | Wt 187.4 lb

## 2017-01-28 DIAGNOSIS — J309 Allergic rhinitis, unspecified: Secondary | ICD-10-CM | POA: Diagnosis not present

## 2017-01-28 DIAGNOSIS — E669 Obesity, unspecified: Secondary | ICD-10-CM | POA: Diagnosis not present

## 2017-01-28 DIAGNOSIS — Z6833 Body mass index (BMI) 33.0-33.9, adult: Secondary | ICD-10-CM | POA: Insufficient documentation

## 2017-01-28 DIAGNOSIS — I499 Cardiac arrhythmia, unspecified: Secondary | ICD-10-CM | POA: Diagnosis not present

## 2017-01-28 DIAGNOSIS — I10 Essential (primary) hypertension: Secondary | ICD-10-CM | POA: Diagnosis not present

## 2017-01-28 DIAGNOSIS — F419 Anxiety disorder, unspecified: Secondary | ICD-10-CM

## 2017-01-28 DIAGNOSIS — E876 Hypokalemia: Secondary | ICD-10-CM

## 2017-01-28 NOTE — Patient Instructions (Addendum)
A few things to remember from today's visit:   Irregular heart rhythm - Plan: EKG 12-Lead  Essential hypertension, benign - Plan: EKG 12-Lead  Allergic rhinitis, unspecified seasonality, unspecified trigger  Hypokalemia  Blood pressure goal for most people is less than 140/90.   Most recent cardiologists' recommendations recommend blood pressure at or less than 130/80.   Elevated blood pressure increases the risk of strokes, heart and kidney disease, and eye problems. Regular physical activity and a healthy diet (DASH diet) usually help. Low salt diet. Take medications as instructed.  Caution with some over the counter medications as cold medications, dietary products (for weight loss), and Ibuprofen or Aleve (frequent use);all these medications could cause elevation of blood pressure.  Please be sure medication list is accurate. If a new problem present, please set up appointment sooner than planned today.

## 2017-01-28 NOTE — Progress Notes (Signed)
HPI:   [name] is a 66 y.o. female, who is here today to establish care.  Former PCP: Dr Kristen Wolf. Last preventive visit: 09/2015.  Chronic medical problems: HTN,HLD, LE edema, and gout among some.   Concerns today:  She is concerned about K+ levels and HTN.   Hypertension:   Dx about 30+ days ago.  Currently on HCTZ 12.5 mg daily and Amlodipine-Valsartan 10-160 mg 1/2 tab daily. She has not been consistent with HCTZ and has not taking amlodipine/valsartan today. Last eye exam about 2 weeks ago. She doesn't check BP at home because she gets anxious.   She has not noted unusual headache, visual changes, exertional chest pain, dyspnea,  focal weakness, or worsening LE edema. LE edema improved, usually worse at the end of the day. She has not noted erythema or leg pain.  HypoK+ noted during recent ER visit, completed 7 days of Rx for KCL. She didn't follow with former PCP, she continued with OTC K+ 1/2 tab bid.  She was in the ER on 01/05/17 c/o palpitations.   Lab Results  Component Value Date   CREATININE 1.16 (H) 01/05/2017   BUN 14 01/05/2017   NA 133 (L) 01/05/2017   K 3.2 (L) 01/05/2017   CL 97 (L) 01/05/2017   CO2 24 01/05/2017   She is also reporting intermittent episodes of palpitations and mid chest tightness, not related with exertion, and no associated diaphoresis or dyspnea. She takes OTC CalMag and it seems to help with palpitation and "anxiety."  Reporting episode of syncope, 3-4 weeks ago. She states that initially she felt dizzy, fell on her knees and right side of her body,LOC for about 2 seconds. She denies associated confusion, urine/bowel incontinence, or seizure-like activity. She thought episode was related to her "sinuses."  Allergic rhinitis: She takes Claritin as needed and has Flonase prescription but she does not use it frequently because afraid of possible side effects of "steroids."  She has had intermittent sinus pressure and  fullness sensation right ear.  She got abx 3-4 weeks ago, Cefdinir (brought bottle). Abx caused diarrhea and worsening palpitations.   According to pt, she received Rx for Metoprolol to treat palpitation, which she discontinued after 3- 4 days because it caused "depression". Diarrhea resolved and palpitations improved.   She does not exercise regularly. She has tried to eat healthier and has noted some wt loss.  Lab Results  Component Value Date   WBC 7.9 01/05/2017   HGB 14.3 01/05/2017   HCT 42.5 01/05/2017   MCV 75.8 (L) 01/05/2017   PLT 288 01/05/2017     Review of Systems  Constitutional: Positive for fatigue. Negative for activity change, appetite change and fever.  HENT: Positive for sinus pressure. Negative for ear discharge, ear pain, mouth sores, nosebleeds, sore throat and trouble swallowing.   Eyes: Negative for redness and visual disturbance.  Respiratory: Positive for chest tightness. Negative for cough, shortness of breath and wheezing.   Cardiovascular: Positive for palpitations and leg swelling (stable).  Gastrointestinal: Negative for abdominal pain, nausea and vomiting.       Negative for changes in bowel habits.  Endocrine: Negative for cold intolerance, heat intolerance, polydipsia, polyphagia and polyuria.  Genitourinary: Negative for decreased urine volume, dysuria and hematuria.  Musculoskeletal: Negative for gait problem and myalgias.  Skin: Negative for pallor and rash.  Allergic/Immunologic: Positive for environmental allergies.  Neurological: Positive for syncope. Negative for seizures, weakness, numbness and headaches.  Hematological: Negative  for adenopathy. Does not bruise/bleed easily.  Psychiatric/Behavioral: Negative for confusion. The patient is nervous/anxious.      Current Outpatient Prescriptions on File Prior to Visit  Medication Sig Dispense Refill  . allopurinol (ZYLOPRIM) 300 MG tablet Take 300 mg by mouth daily.     Marland Kitchen  amLODipine-valsartan (EXFORGE) 10-160 MG tablet Take 1 tablet by mouth daily.    . cholecalciferol (VITAMIN D) 1000 UNITS tablet Take 1,000 Units by mouth daily.     Marland Kitchen co-enzyme Q-10 30 MG capsule Take 30 mg by mouth daily.      . fluticasone (FLONASE) 50 MCG/ACT nasal spray Place 1 spray into both nostrils daily.    . hydrochlorothiazide (MICROZIDE) 12.5 MG capsule Take 12.5 mg by mouth every morning.    . potassium chloride SA (K-DUR,KLOR-CON) 20 MEQ tablet Take 1 tablet (20 mEq total) by mouth daily. 7 tablet 0  . pravastatin (PRAVACHOL) 40 MG tablet Take 40 mg by mouth daily.     No current facility-administered medications on file prior to visit.      Past Medical History:  Diagnosis Date  . Allergy   . Arthritis    hands  . Cancer (HCC)    hx rectal cancer  . 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 Cyst (solitary) of breast    L breast cyst, mamogram in September "unchanged"  . Gout   . Hyperlipidemia   . Hypertension   . Kidney cysts   . Liver cyst    Allergies  Allergen Reactions  . Cefdinir Diarrhea    Family History  Problem Relation Age of Onset  . Asthma Mother   . Hypertension Father   . Multiple myeloma Sister   . Colon cancer Neg Hx   . Esophageal cancer Neg Hx   . Stomach cancer Neg Hx     Social History   Social History  . Marital status: Single    Spouse name: N/A  . Number of children: N/A  . Years of education: N/A   Social History Main Topics  . Smoking status: Never Smoker  . Smokeless tobacco: Never Used  . Alcohol use No  . Drug use: No  . Sexual activity: Not Asked   Other Topics Concern  . None   Social History Narrative  . None    Vitals:   01/28/17 1616 01/28/17 1711  BP: (!) 142/100 (!) 160/90  Pulse: 100   Resp: 12   SpO2: 96%    Body mass index is 33.19 kg/m.   Physical Exam  Nursing note and vitals reviewed. Constitutional: She is oriented to person, place, and time. She appears well-developed. No distress.    HENT:  Head: Atraumatic.  Right Ear: External ear and ear canal normal. A middle ear effusion is present.  Left Ear: Tympanic membrane, external ear and ear canal normal.  Nose: Right sinus exhibits no maxillary sinus tenderness and no frontal sinus tenderness. Left sinus exhibits no maxillary sinus tenderness and no frontal sinus tenderness.  Mouth/Throat: Oropharynx is clear and moist and mucous membranes are normal.  Hypertrophic turbinates.  Eyes: Pupils are equal, round, and reactive to light. Conjunctivae and EOM are normal.  Neck: No tracheal deviation present. No thyroid mass and no thyromegaly present.  Cardiovascular: Normal rate.  An irregular rhythm present.  No murmur heard. Pulses:      Dorsalis pedis pulses are 2+ on the right side, and 2+ on the left side.  Respiratory: Effort normal and breath sounds  normal. No respiratory distress.  GI: Soft. She exhibits no mass. There is no hepatomegaly. There is no tenderness.  Musculoskeletal: She exhibits edema (Mild peri ankle edema L>R, not pitting.). She exhibits no tenderness.  Lymphadenopathy:    She has no cervical adenopathy.  Neurological: She is alert and oriented to person, place, and time. She has normal strength. No cranial nerve deficit. Gait normal.  Reflex Scores:      Patellar reflexes are 2+ on the right side and 2+ on the left side. Skin: Skin is warm. No erythema.  Psychiatric: Her mood appears anxious.  Well groomed, good eye contact.     ASSESSMENT AND PLAN:   Ms. Shuntel was seen today for establish care.  Diagnoses and all orders for this visit:  Irregular heart rhythm  EKG today SR, PVC's, LAE,unsp T wave abnormalities anterior/lat leads. EKG 01/05/17:sinus tach not longer present,rest no significant changes. Instructed about warning signs. Referral to cardiology placed.  -     EKG 12-Lead -     Ambulatory referral to Cardiology -     TSH -     Basic metabolic panel -     Magnesium  Essential  hypertension, benign  Not well controlled. Possible complications of elevated BP discussed. She does not want to have meds adjusted, take meds daily. Changes Amlodipine-Valsartan at night and HCTZ  12.5 mg in am. Monitoring BP exacerbate anxiety,so not recommended. Low salt diet. Instructed about warning signs. F/U in 2 weeks.  -     EKG 12-Lead -     Basic metabolic panel  Allergic rhinitis, unspecified seasonality, unspecified trigger  Flonase nasal spray side effects discussed, recommended. Nasal irrigations with saline.  Hypokalemia  No changes in current management, will follow labs done today and will give further recommendations accordingly.  -     Basic metabolic panel  Class 1 obesity with body mass index (BMI) of 33.0 to 33.9 in adult, unspecified obesity type, unspecified whether serious comorbidity present  We discussed benefits of wt loss as well as adverse effects of obesity. Consistency with healthy diet and physical activity recommended. Daily walking for 15-30 min as tolerated  Anxiety disorder, unspecified type  Could be causing some of her symptoms. After cardiology evaluation we can discuss pharmacologic options.      Jordyn Doane G. Martinique, MD  [facility]. [place] office.

## 2017-01-29 LAB — BASIC METABOLIC PANEL
BUN: 12 mg/dL (ref 6–23)
CO2: 27 mEq/L (ref 19–32)
CREATININE: 1.09 mg/dL (ref 0.40–1.20)
Calcium: 10.3 mg/dL (ref 8.4–10.5)
Chloride: 100 mEq/L (ref 96–112)
GFR: 64.27 mL/min (ref >60.00)
Glucose, Bld: 102 mg/dL — ABNORMAL HIGH (ref 70–99)
POTASSIUM: 3.7 meq/L (ref 3.5–5.1)
Sodium: 138 mEq/L (ref 135–145)

## 2017-01-29 LAB — TSH: TSH: 1.6 u[IU]/mL (ref 0.35–4.50)

## 2017-01-29 LAB — MAGNESIUM: MAGNESIUM: 2 mg/dL (ref 1.5–2.5)

## 2017-01-30 ENCOUNTER — Telehealth: Payer: Self-pay | Admitting: Family Medicine

## 2017-01-30 NOTE — Telephone Encounter (Signed)
° ° °  Pt would like a call back about her lab. She said she can not get in to see the Cardiologist until September

## 2017-01-30 NOTE — Telephone Encounter (Signed)
Have her labs come back yet?

## 2017-01-31 ENCOUNTER — Encounter: Payer: Self-pay | Admitting: Family Medicine

## 2017-01-31 NOTE — Telephone Encounter (Signed)
Results sent through MyChart

## 2017-02-10 NOTE — Progress Notes (Signed)
HPI:   Kristen Wolf is a 66 y.o. female, who is here today to follow on recent OV.   She was seen on 01/28/17. Last OV she was c/o palpitations and chest discomfort, tightness. She also reported episode of syncope 2 weeks earlier. She was referred to cardiologists, appt on 03/12/17.  She started "walking some", about 10 days ago. States that exercise has helped with stress and palpitations. She takes OTC CalMag 1 teaspoon daily as needed for palpitations and anxiety, it helps. She did not tolerate Metoprolol , caused depressed mood.  Hypertension:   For 30+ years. Last OV BP was elevated, she did not want antihypertensive meds adjusted. Home BP's: She is not checking it because causes stress/anxiety.  Currently on Amlodipine-Valsartan 10-160 mg daily and HCTZ 12.5 mg daily.  She has not noted unusual headache, visual changes, exertional chest pain, dyspnea,  focal weakness, or edema.   Lab Results  Component Value Date   CREATININE 1.09 01/28/2017   BUN 12 01/28/2017   NA 138 01/28/2017   K 3.7 01/28/2017   CL 100 01/28/2017   CO2 27 01/28/2017    She is eating more foods with K+. Stopped OTC K+ supplementation.   Review of Systems  Constitutional: Positive for fatigue. Negative for activity change, appetite change, fever and unexpected weight change.  HENT: Negative for mouth sores, nosebleeds and trouble swallowing.   Eyes: Negative for redness and visual disturbance.  Respiratory: Negative for cough, shortness of breath and wheezing.   Cardiovascular: Positive for palpitations. Negative for chest pain and leg swelling.  Gastrointestinal: Negative for abdominal pain, nausea and vomiting.       Negative for changes in bowel habits.  Genitourinary: Negative for decreased urine volume, dysuria and hematuria.  Neurological: Negative for syncope, weakness and headaches.  Psychiatric/Behavioral: Negative for confusion. The patient is nervous/anxious.        Current Outpatient Prescriptions on File Prior to Visit  Medication Sig Dispense Refill  . amLODipine-valsartan (EXFORGE) 10-160 MG tablet Take 1 tablet by mouth daily.    . cholecalciferol (VITAMIN D) 1000 UNITS tablet Take 1,000 Units by mouth daily.     Marland Kitchen co-enzyme Q-10 30 MG capsule Take 30 mg by mouth daily.      . fluticasone (FLONASE) 50 MCG/ACT nasal spray Place 1 spray into both nostrils daily.    . hydrochlorothiazide (MICROZIDE) 12.5 MG capsule Take 12.5 mg by mouth every morning.     No current facility-administered medications on file prior to visit.      Past Medical History:  Diagnosis Date  . Allergy   . Arthritis    hands  . Cancer (HCC)    hx rectal cancer  . 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 Cyst (solitary) of breast    L breast cyst, mamogram in September "unchanged"  . Gout   . Hyperlipidemia   . Hypertension   . Kidney cysts   . Liver cyst    Allergies  Allergen Reactions  . Cefdinir Diarrhea    Social History   Social History  . Marital status: Divorced    Spouse name: N/A  . Number of children: N/A  . Years of education: N/A   Social History Main Topics  . Smoking status: Never Smoker  . Smokeless tobacco: Never Used  . Alcohol use No  . Drug use: No  . Sexual activity: Not Asked   Other Topics Concern  . None   Social  History Narrative  . None    Vitals:   02/11/17 0844 02/11/17 0926  BP: 130/70   Pulse: (!) 115 (!) 101  Resp: 12   SpO2: 98%    Body mass index is 32.79 kg/m.   Physical Exam  Nursing note and vitals reviewed. Constitutional: She is oriented to person, place, and time. She appears well-developed. No distress.  HENT:  Head: Normocephalic and atraumatic.  Mouth/Throat: Oropharynx is clear and moist and mucous membranes are normal.  Eyes: Pupils are equal, round, and reactive to light. Conjunctivae are normal.  Cardiovascular: An irregular rhythm present. Tachycardia present.   No murmur  heard. Pulses:      Dorsalis pedis pulses are 2+ on the right side, and 2+ on the left side.  Respiratory: Effort normal and breath sounds normal. No respiratory distress.  GI: Soft. She exhibits no mass. There is no hepatomegaly. There is no tenderness.  Musculoskeletal: She exhibits no edema.  Lymphadenopathy:    She has no cervical adenopathy.  Neurological: She is alert and oriented to person, place, and time. She has normal strength. Coordination and gait normal.  Skin: Skin is warm. No erythema.  Psychiatric: Her mood appears anxious.  Well groomed, good eye contact.    ASSESSMENT AND PLAN:   [name] was seen today for follow-up.  Diagnoses and all orders for this visit:  Essential hypertension, benign  Bettercontrolled. No changes in current management. DASH-low salt diet recommended. F/U in 3 months, before if needed.  Irregular heart rate  Reporting improved palpitations but not resolved. EKG last OV with sinus tach and PVC's. Instructed about warning signs. Instructed to keep cardio appt.   Hypokalemia  Continue K+ rich diet. Some side effects of HCTZ discussed.  Anxiety disorder, unspecified type  She does not feel like pharmacologic treatment is needed at this time. OTC CalMag and regular exercise like walking help. We will follow in 3 months.    Betty G. Martinique, MD  [facility]. [place] office.

## 2017-02-11 ENCOUNTER — Ambulatory Visit (INDEPENDENT_AMBULATORY_CARE_PROVIDER_SITE_OTHER): Payer: Medicare Other | Admitting: Family Medicine

## 2017-02-11 ENCOUNTER — Encounter: Payer: Self-pay | Admitting: Family Medicine

## 2017-02-11 VITALS — BP 130/70 | HR 101 | Resp 12 | Ht 63.0 in | Wt 185.1 lb

## 2017-02-11 DIAGNOSIS — F419 Anxiety disorder, unspecified: Secondary | ICD-10-CM | POA: Diagnosis not present

## 2017-02-11 DIAGNOSIS — I499 Cardiac arrhythmia, unspecified: Secondary | ICD-10-CM

## 2017-02-11 DIAGNOSIS — I10 Essential (primary) hypertension: Secondary | ICD-10-CM

## 2017-02-11 DIAGNOSIS — E876 Hypokalemia: Secondary | ICD-10-CM | POA: Diagnosis not present

## 2017-02-11 NOTE — Patient Instructions (Signed)
A few things to remember from today's visit:   Essential hypertension, benign  No changes today. [place] cardiology appt.   DASH Eating Plan DASH stands for "Dietary Approaches to Stop Hypertension." The DASH eating plan is a healthy eating plan that has been shown to reduce high blood pressure (hypertension). It may also reduce your risk for type 2 diabetes, heart disease, and stroke. The DASH eating plan may also help with weight loss. What are tips for following this plan? General guidelines  Avoid eating more than 2,300 mg (milligrams) of salt (sodium) a day. If you have hypertension, you may need to reduce your sodium intake to 1,500 mg a day.  Limit alcohol intake to no more than 1 drink a day for nonpregnant women and 2 drinks a day for men. One drink equals 12 oz of beer, 5 oz of wine, or 1 oz of hard liquor.  Work with your health care provider to maintain a healthy body weight or to lose weight. Ask what an ideal weight is for you.  Get at least 30 minutes of exercise that causes your heart to beat faster (aerobic exercise) most days of the week. Activities may include walking, swimming, or biking.  Work with your health care provider or diet and nutrition specialist (dietitian) to adjust your eating plan to your individual calorie needs. Reading food labels  Check food labels for the amount of sodium per serving. Choose foods with less than 5 percent of the Daily Value of sodium. Generally, foods with less than 300 mg of sodium per serving fit into this eating plan.  To find whole grains, look for the word "whole" as the first word in the ingredient list. Shopping  Buy products labeled as "low-sodium" or "no salt added."  Buy fresh foods. Avoid canned foods and premade or frozen meals. Cooking  Avoid adding salt when cooking. Use salt-free seasonings or herbs instead of table salt or sea salt. Check with your health care provider or pharmacist before using salt  substitutes.  Do not fry foods. Cook foods using healthy methods such as baking, boiling, grilling, and broiling instead.  Cook with heart-healthy oils, such as olive, canola, soybean, or sunflower oil. Meal planning   Eat a balanced diet that includes: ? 5 or more servings of fruits and vegetables each day. At each meal, try to fill half of your plate with fruits and vegetables. ? Up to 6-8 servings of whole grains each day. ? Less than 6 oz of lean meat, poultry, or fish each day. A 3-oz serving of meat is about the same size as a deck of cards. One egg equals 1 oz. ? 2 servings of low-fat dairy each day. ? A serving of nuts, seeds, or beans 5 times each week. ? Heart-healthy fats. Healthy fats called Omega-3 fatty acids are found in foods such as flaxseeds and coldwater fish, like sardines, salmon, and mackerel.  Limit how much you eat of the following: ? Canned or prepackaged foods. ? Food that is high in trans fat, such as fried foods. ? Food that is high in saturated fat, such as fatty meat. ? Sweets, desserts, sugary drinks, and other foods with added sugar. ? Full-fat dairy products.  Do not salt foods before eating.  Try to eat at least 2 vegetarian meals each week.  Eat more home-cooked food and less restaurant, buffet, and fast food.  When eating at a restaurant, ask that your food be prepared with less salt or no  no salt, if possible. °What foods are recommended? °The items listed may not be a complete list. Talk with your dietitian about what dietary choices are best for you. °Grains °Whole-grain or whole-wheat bread. Whole-grain or whole-wheat pasta. Brown rice. Oatmeal. Quinoa. Bulgur. Whole-grain and low-sodium cereals. Pita bread. Low-fat, low-sodium crackers. Whole-wheat flour tortillas. °Vegetables °Fresh or frozen vegetables (raw, steamed, roasted, or grilled). Low-sodium or reduced-sodium tomato and vegetable juice. Low-sodium or reduced-sodium tomato sauce and tomato  paste. Low-sodium or reduced-sodium canned vegetables. °Fruits °All fresh, dried, or frozen fruit. Canned fruit in natural juice (without added sugar). °Meat and other protein foods °Skinless chicken or turkey. Ground chicken or turkey. Pork with fat trimmed off. Fish and seafood. Egg whites. Dried beans, peas, or lentils. Unsalted nuts, nut butters, and seeds. Unsalted canned beans. Lean cuts of beef with fat trimmed off. Low-sodium, lean deli meat. °Dairy °Low-fat (1%) or fat-free (skim) milk. Fat-free, low-fat, or reduced-fat cheeses. Nonfat, low-sodium ricotta or cottage cheese. Low-fat or nonfat yogurt. Low-fat, low-sodium cheese. °Fats and oils °Soft margarine without trans fats. Vegetable oil. Low-fat, reduced-fat, or light mayonnaise and salad dressings (reduced-sodium). Canola, safflower, olive, soybean, and sunflower oils. Avocado. °Seasoning and other foods °Herbs. Spices. Seasoning mixes without salt. Unsalted popcorn and pretzels. Fat-free sweets. °What foods are not recommended? °The items listed may not be a complete list. Talk with your dietitian about what dietary choices are best for you. °Grains °Baked goods made with fat, such as croissants, muffins, or some breads. Dry pasta or rice meal packs. °Vegetables °Creamed or fried vegetables. Vegetables in a cheese sauce. Regular canned vegetables (not low-sodium or reduced-sodium). Regular canned tomato sauce and paste (not low-sodium or reduced-sodium). Regular tomato and vegetable juice (not low-sodium or reduced-sodium). Pickles. Olives. °Fruits °Canned fruit in a light or heavy syrup. Fried fruit. Fruit in cream or butter sauce. °Meat and other protein foods °Fatty cuts of meat. Ribs. Fried meat. Bacon. Sausage. Bologna and other processed lunch meats. Salami. Fatback. Hotdogs. Bratwurst. Salted nuts and seeds. Canned beans with added salt. Canned or smoked fish. Whole eggs or egg yolks. Chicken or turkey with skin. °Dairy °Whole or 2% milk,  cream, and half-and-half. Whole or full-fat cream cheese. Whole-fat or sweetened yogurt. Full-fat cheese. Nondairy creamers. Whipped toppings. Processed cheese and cheese spreads. °Fats and oils °Butter. Stick margarine. Lard. Shortening. Ghee. Bacon fat. Tropical oils, such as coconut, palm kernel, or palm oil. °Seasoning and other foods °Salted popcorn and pretzels. Onion salt, garlic salt, seasoned salt, table salt, and sea salt. Worcestershire sauce. Tartar sauce. Barbecue sauce. Teriyaki sauce. Soy sauce, including reduced-sodium. Steak sauce. Canned and packaged gravies. Fish sauce. Oyster sauce. Cocktail sauce. Horseradish that you find on the shelf. Ketchup. Mustard. Meat flavorings and tenderizers. Bouillon cubes. Hot sauce and Tabasco sauce. Premade or packaged marinades. Premade or packaged taco seasonings. Relishes. Regular salad dressings. °Where to find more information: °· National Heart, Lung, and Blood Institute: www.nhlbi.nih.gov °· American Heart Association: www.heart.org °Summary °· The DASH eating plan is a healthy eating plan that has been shown to reduce high blood pressure (hypertension). It may also reduce your risk for type 2 diabetes, heart disease, and stroke. °· With the DASH eating plan, you should limit salt (sodium) intake to 2,300 mg a day. If you have hypertension, you may need to reduce your sodium intake to 1,500 mg a day. °· When on the DASH eating plan, aim to eat more fresh fruits and vegetables, whole grains, lean proteins, low-fat dairy, and   fats.  Work with your health care provider or diet and nutrition specialist (dietitian) to adjust your eating plan to your individual calorie needs. This information is not intended to replace advice given to you by your health care provider. Make sure you discuss any questions you have with your health care provider. Document Released: 05/24/2011 Document Revised: 05/28/2016 Document Reviewed: 05/28/2016 Elsevier  Interactive Patient Education  2017 [employer].   Please be sure medication list is accurate. If a new problem present, please set up appointment sooner than planned today.

## 2017-03-01 ENCOUNTER — Encounter: Payer: Self-pay | Admitting: Cardiology

## 2017-03-07 ENCOUNTER — Encounter: Payer: Self-pay | Admitting: Family Medicine

## 2017-03-12 ENCOUNTER — Encounter: Payer: Self-pay | Admitting: Cardiology

## 2017-03-12 ENCOUNTER — Ambulatory Visit (INDEPENDENT_AMBULATORY_CARE_PROVIDER_SITE_OTHER): Payer: Medicare Other | Admitting: Cardiology

## 2017-03-12 VITALS — BP 142/68 | HR 68 | Ht 63.0 in | Wt 183.8 lb

## 2017-03-12 DIAGNOSIS — I493 Ventricular premature depolarization: Secondary | ICD-10-CM | POA: Diagnosis not present

## 2017-03-12 DIAGNOSIS — R002 Palpitations: Secondary | ICD-10-CM | POA: Diagnosis not present

## 2017-03-12 DIAGNOSIS — I10 Essential (primary) hypertension: Secondary | ICD-10-CM

## 2017-03-12 DIAGNOSIS — R079 Chest pain, unspecified: Secondary | ICD-10-CM

## 2017-03-12 NOTE — Progress Notes (Signed)
Cardiology Office Note    Date:  03/12/2017   ID:  Kristen Wolf, DOB [birth year], MRN [id number]  PCP:  Martinique, Betty G, MD  Cardiologist:  Fransico Him, MD   Chief Complaint  Patient presents with  . Chest Pain  . Palpitations    History of Present Illness:  Kristen Wolf is a 66 y.o. female who is being seen today for the evaluation of palpitations and chest pain at the request of Martinique, Malka So, MD.  The patient states that she has been having palpitations for about 3 months.  She was at her father's house one day and got very hot and passed out.  She had gotten up very fast and thinks her Bp dropped.  She denied any tongue biting or incontinence. She got up immediately and drove home and has not had any problems since then.  She saw her MD and was diagnosed with a sinus infection and then was placed on antibx which caused severe diarrhea and that lasted about a week.  She then woke up in the middle of the night and her left arm was in spasm and she went to the ER and she was found to be hypokalemic.  She is now taking OTC K+ suppl.  She is also taking magnesium and both have helped the palpitations and she has not had any in the past month.  She is now walking daily for about an hour with no chest pain or pressure and actually feels better now. She says now that when she comes back from walking and gets pressure in her chest that resolves with belching.  She denies any exertional CP or pressure and no exertional SOB.     Past Medical History:  Diagnosis Date  . Allergy   . Arthritis    hands  . Cancer (HCC)    hx rectal cancer  . 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 Cyst (solitary) of breast    L breast cyst, mamogram in September "unchanged"  . Gout   . Hyperlipidemia   . Hypertension   . Kidney cysts   . Liver cyst     Past Surgical History:  Procedure Laterality Date  . ABDOMINAL HYSTERECTOMY     1993  . APPENDECTOMY     1980  . COLONOSCOPY  03/2011   Hx rectal cancer  . PARATHYROIDECTOMY     2005  . PARATHYROIDECTOMY     2006  . POLYPECTOMY    . TUBAL LIGATION     1980  . TUBAL LIGATION      Current Medications: Current Meds  Medication Sig  . amLODipine-valsartan (EXFORGE) 10-160 MG tablet Take 1 tablet by mouth daily.  . Calcium-Magnesium (CAL-MAG PO) Take by mouth every morning. Powder  . cholecalciferol (VITAMIN D) 1000 UNITS tablet Take 1,000 Units by mouth daily.   Marland Kitchen co-enzyme Q-10 30 MG capsule Take 30 mg by mouth daily.    . fluticasone (FLONASE) 50 MCG/ACT nasal spray Place 1 spray into both nostrils daily.  . hydrochlorothiazide (MICROZIDE) 12.5 MG capsule Take 12.5 mg by mouth every morning.  . IRON PO Take by mouth 2 (two) times a week.  . Magnesium 250 MG TABS Take 250 mg by mouth daily.  Marland Kitchen POTASSIUM PO Take by mouth daily.    Allergies:   Cefdinir   Social History   Social History  . Marital status: Divorced    Spouse name: N/A  . Number of children: N/A  .  Years of education: N/A   Social History Main Topics  . Smoking status: Never Smoker  . Smokeless tobacco: Never Used  . Alcohol use No  . Drug use: No  . Sexual activity: Not Asked   Other Topics Concern  . None   Social History Narrative  . None     Family History:  The patient's family history includes Asthma in her mother; Hypertension in her father; Multiple myeloma in her sister.   ROS:   Please see the history of present illness.    ROS All other systems reviewed and are negative.  No flowsheet data found.     PHYSICAL EXAM:   VS:  BP (!) 142/68   Pulse 68   Ht '5\' 3"'  (1.6 m)   Wt 183 lb 12.8 oz (83.4 kg)   SpO2 99%   BMI 32.56 kg/m    GEN: Well nourished, well developed, in no acute distress  HEENT: normal  Neck: no JVD, carotid bruits, or masses Cardiac: RRR; no murmurs, rubs, or gallops,no edema.  Intact distal pulses bilaterally.  Respiratory:  clear to auscultation bilaterally, normal work of breathing GI: soft,  nontender, nondistended, + BS MS: no deformity or atrophy  Skin: warm and dry, no rash Neuro:  Alert and Oriented x 3, Strength and sensation are intact Psych: euthymic mood, full affect  Wt Readings from Last 3 Encounters:  03/12/17 183 lb 12.8 oz (83.4 kg)  02/11/17 185 lb 2 oz (84 kg)  01/28/17 187 lb 6 oz (85 kg)      Studies/Labs Reviewed:   EKG:  EKG isnot ordered today.   Recent Labs: 01/05/2017: Hemoglobin 14.3; Platelets 288 01/28/2017: BUN 12; Creatinine, Ser 1.09; Magnesium 2.0; Potassium 3.7; Sodium 138; TSH 1.60   Lipid Panel No results found for: CHOL, TRIG, HDL, CHOLHDL, VLDL, LDLCALC, LDLDIRECT  Additional studies/ records that were reviewed today include:  Office visit note    ASSESSMENT:    1. Chest pain, unspecified type   2. Heart palpitations   3. Essential hypertension, benign      PLAN:  In order of problems listed above:  1.  Chest pain - Her symptoms are very vague but she has PVCs on EKG and also has CRFs including HTN, obesity and hyperlipidemia.  I will get a nuclear stress test to rule out ischemia. I will get a 2D echo to assess for structural heart disease.   2.  Palpitations - likely these are PVCs since there are some on her last EKG and they likely were increased in frequency in the setting of recent diarrheal illness and hypokalemia.  These seemed to have improved with OTC K+ and Mag supp.  I will get a 24 hour monitor to assess PVC load.   3.  HTN - BP is well controlled on heart monitor.  She will continue on amlodipine/valsartan 10-125m daily and diuretic.      Medication Adjustments/Labs and Tests Ordered: Current medicines are reviewed at length with the patient today.  Concerns regarding medicines are outlined above.  Medication changes, Labs and Tests ordered today are listed in the Patient Instructions below.  There are no Patient Instructions on file for this visit.   Signed, TFransico Him MD  03/12/2017 2:06 PM      [facility] [address] [place]  [id number]: [phone] Fax: [phone]

## 2017-03-12 NOTE — Patient Instructions (Signed)
Medication Instructions:  Your provider recommends that you continue on your current medications as directed. Please refer to the Current Medication list given to you today.    Labwork: None  Testing/Procedures: Your physician has recommended that you wear a holter monitor. Holter monitors are medical devices that record the heart's electrical activity. Doctors most often use these monitors to diagnose arrhythmias. Arrhythmias are problems with the speed or rhythm of the heartbeat. The monitor is a small, portable device. You can wear one while you do your normal daily activities. This is usually used to diagnose what is causing palpitations/syncope (passing out).  Your provider has requested that you have an echocardiogram. Echocardiography is a painless test that uses sound waves to create images of your heart. It provides your doctor with information about the size and shape of your heart and how well your heart's chambers and valves are working. This procedure takes approximately one hour. There are no restrictions for this procedure.   Your provider has requested that you have an exercise tolerance test. For further information please visit [email or web address]. Please also follow instruction sheet, as given.  Follow-Up: Your provider recommends that you schedule a follow-up appointment AS NEEDED with Dr. Radford Pax pending study results.  Any Other Special Instructions Will Be Listed Below (If Applicable).     If you need a refill on your cardiac medications before your next appointment, please call your pharmacy.

## 2017-03-22 ENCOUNTER — Ambulatory Visit (INDEPENDENT_AMBULATORY_CARE_PROVIDER_SITE_OTHER): Payer: Medicare Other

## 2017-03-22 DIAGNOSIS — R002 Palpitations: Secondary | ICD-10-CM | POA: Diagnosis not present

## 2017-03-22 DIAGNOSIS — I493 Ventricular premature depolarization: Secondary | ICD-10-CM

## 2017-03-27 ENCOUNTER — Telehealth: Payer: Self-pay | Admitting: Cardiology

## 2017-03-27 NOTE — Telephone Encounter (Signed)
New message    Art from [facility] is calling stating the monitor results have been sent.

## 2017-03-27 NOTE — Telephone Encounter (Signed)
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 Discussed with Joellen Jersey and she will send monitor over for Dr. Curt Bears, DOD to read.

## 2017-03-28 ENCOUNTER — Ambulatory Visit: Payer: Medicare Other

## 2017-03-28 ENCOUNTER — Other Ambulatory Visit: Payer: Self-pay

## 2017-03-28 ENCOUNTER — Ambulatory Visit (HOSPITAL_COMMUNITY): Payer: Medicare Other | Attending: Cardiovascular Disease

## 2017-03-28 DIAGNOSIS — I493 Ventricular premature depolarization: Secondary | ICD-10-CM

## 2017-03-28 DIAGNOSIS — E785 Hyperlipidemia, unspecified: Secondary | ICD-10-CM | POA: Insufficient documentation

## 2017-03-28 DIAGNOSIS — K7689 Other specified diseases of liver: Secondary | ICD-10-CM | POA: Insufficient documentation

## 2017-03-28 DIAGNOSIS — R079 Chest pain, unspecified: Secondary | ICD-10-CM | POA: Diagnosis not present

## 2017-03-28 DIAGNOSIS — I351 Nonrheumatic aortic (valve) insufficiency: Secondary | ICD-10-CM | POA: Insufficient documentation

## 2017-03-28 DIAGNOSIS — I10 Essential (primary) hypertension: Secondary | ICD-10-CM | POA: Insufficient documentation

## 2017-03-28 DIAGNOSIS — I492 Junctional premature depolarization: Secondary | ICD-10-CM | POA: Diagnosis present

## 2017-04-01 ENCOUNTER — Telehealth: Payer: Self-pay

## 2017-04-01 ENCOUNTER — Telehealth (HOSPITAL_COMMUNITY): Payer: Self-pay | Admitting: *Deleted

## 2017-04-01 DIAGNOSIS — R079 Chest pain, unspecified: Secondary | ICD-10-CM

## 2017-04-01 NOTE — Telephone Encounter (Signed)
Patient given detailed instructions per Myocardial Perfusion Study Information Sheet for the test on 04/03/17 at 0745. Patient notified to arrive 15 minutes early and that it is imperative to arrive on time for appointment to keep from having the test rescheduled.  If you need to cancel or reschedule your appointment, please call the office within 24 hours of your appointment. . Patient verbalized understanding.Sharyn Brilliant, Ranae Palms

## 2017-04-01 NOTE — Telephone Encounter (Signed)
Sueanne Margarita, MD  Theodoro Parma, RN        Lets do stress myoview that way we can see what her AV node does with exercise   Traci     Informed patient Dr. Radford Pax would like stress myoview. Reviewed instructions. She understands to fast 2 hours prior to the test, to avoid caffeine and decaf products for 12 hours prior to the test, to dress appropriately for exercise, and to avoid lotion and perfume. She understands she will be called to arrange appointment.  She was grateful for call and agrees with treatment plan.

## 2017-04-03 ENCOUNTER — Ambulatory Visit (HOSPITAL_COMMUNITY): Payer: Medicare Other | Attending: Cardiovascular Disease

## 2017-04-03 DIAGNOSIS — R55 Syncope and collapse: Secondary | ICD-10-CM | POA: Diagnosis not present

## 2017-04-03 DIAGNOSIS — R079 Chest pain, unspecified: Secondary | ICD-10-CM

## 2017-04-03 DIAGNOSIS — R002 Palpitations: Secondary | ICD-10-CM | POA: Insufficient documentation

## 2017-04-03 DIAGNOSIS — I10 Essential (primary) hypertension: Secondary | ICD-10-CM | POA: Diagnosis not present

## 2017-04-03 DIAGNOSIS — I2510 Atherosclerotic heart disease of native coronary artery without angina pectoris: Secondary | ICD-10-CM | POA: Diagnosis not present

## 2017-04-03 LAB — MYOCARDIAL PERFUSION IMAGING
CHL CUP NUCLEAR SDS: 5
CHL CUP NUCLEAR SRS: 14
CHL CUP NUCLEAR SSS: 18
CHL RATE OF PERCEIVED EXERTION: 18
CSEPED: 3 min
CSEPEW: 4.9 METS
CSEPPHR: 155 {beats}/min
Exercise duration (sec): 15 s
LHR: 0.27
LV dias vol: 54 mL (ref 46–106)
LVSYSVOL: 17 mL
MPHR: 153 {beats}/min
NUC STRESS TID: 1.08
Percent HR: 101 %
Rest HR: 77 {beats}/min

## 2017-04-03 MED ORDER — TECHNETIUM TC 99M TETROFOSMIN IV KIT
10.9000 | PACK | Freq: Once | INTRAVENOUS | Status: AC | PRN
  Administered 2017-04-03: 10.9 via INTRAVENOUS
  Filled 2017-04-03: qty 11

## 2017-04-03 MED ORDER — TECHNETIUM TC 99M TETROFOSMIN IV KIT
32.4000 | PACK | Freq: Once | INTRAVENOUS | Status: AC | PRN
  Administered 2017-04-03: 32.4 via INTRAVENOUS
  Filled 2017-04-03: qty 33

## 2017-04-30 NOTE — Progress Notes (Signed)
HPI:   [name] is a 66 y.o. female, who is here today to follow on some chronic medical problems.  She was last seen on 02/11/17. Since her last OV she has seen Dr Eula Listen to chest pain. She had cardiac work-up done and according to pt, negative.   Hypertension:   Chronic problem, Dx 30+ years ago. She has not checked BP at home, doing so exacerbates anxiety.  Currently on Amlodipine-Valsartan 10-160 mg and HCTZ 12.5 mg daily.  She is taking medications as instructed, no side effects reported.  She has not noted unusual headache, visual changes, exertional chest pain, dyspnea,  focal weakness, or edema.   Lab Results  Component Value Date   CREATININE 1.09 01/28/2017   BUN 12 01/28/2017   NA 138 01/28/2017   K 3.7 01/28/2017   CL 100 01/28/2017   CO2 27 01/28/2017     Obesity:  Dietary changes since last OV: Eating 2 meals per day, salad at night,and in general controlling portions. Exercise: She walks about 2-3 miles daily.  She has lost inches and clothes fit loose.   Exercise is helping with anxiety. She also feels better knowing that cardiac test were otherwise normal. She denies depressed mood or suicidal thoughts.     Review of Systems  Constitutional: Negative for activity change, appetite change, fatigue, fever and unexpected weight change.  HENT: Negative for mouth sores, nosebleeds and trouble swallowing.   Eyes: Negative for redness and visual disturbance.  Respiratory: Negative for cough, shortness of breath and wheezing.   Cardiovascular: Negative for chest pain, palpitations and leg swelling.  Gastrointestinal: Negative for abdominal pain, nausea and vomiting.       Negative for changes in bowel habits.  Genitourinary: Negative for decreased urine volume, dysuria and hematuria.  Neurological: Negative for syncope, weakness and headaches.  Psychiatric/Behavioral: Negative for confusion and suicidal ideas.  The patient is nervous/anxious.       Current Outpatient Medications on File Prior to Visit  Medication Sig Dispense Refill  . amLODipine-valsartan (EXFORGE) 10-160 MG tablet Take 1 tablet by mouth daily.    . Calcium-Magnesium (CAL-MAG PO) Take by mouth every morning. Powder    . cholecalciferol (VITAMIN D) 1000 UNITS tablet Take 1,000 Units by mouth daily.     Marland Kitchen co-enzyme Q-10 30 MG capsule Take 30 mg by mouth daily.      . fluticasone (FLONASE) 50 MCG/ACT nasal spray Place 1 spray into both nostrils daily.    . hydrochlorothiazide (MICROZIDE) 12.5 MG capsule Take 12.5 mg by mouth every morning.    . IRON PO Take by mouth 2 (two) times a week.    . Magnesium 250 MG TABS Take 250 mg by mouth daily.    Marland Kitchen POTASSIUM PO Take by mouth daily.     No current facility-administered medications on file prior to visit.      Past Medical History:  Diagnosis Date  . Allergy   . Arthritis    hands  . Cancer (HCC)    hx rectal cancer  . 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 Cyst (solitary) of breast    L breast cyst, mamogram in September "unchanged"  . Gout   . Hyperlipidemia   . Hypertension   . Kidney cysts   . Liver cyst    Allergies  Allergen Reactions  . Cefdinir Diarrhea    Social History   Socioeconomic History  . Marital status: Divorced  Spouse name: None  . Number of children: None  . Years of education: None  . Highest education level: None  Social Needs  . Financial resource strain: None  . Food insecurity - worry: None  . Food insecurity - inability: None  . Transportation needs - medical: None  . Transportation needs - non-medical: None  Occupational History  . None  Tobacco Use  . Smoking status: Never Smoker  . Smokeless tobacco: Never Used  Substance and Sexual Activity  . Alcohol use: No  . Drug use: No  . Sexual activity: None  Other Topics Concern  . None  Social History Narrative  . None    Vitals:   05/01/17 0818  BP: 133/84  Pulse: 84   Resp: 16  Temp: 97.9 F (36.6 C)  SpO2: 99%   Body mass index is 31.91 kg/m.   Wt Readings from Last 3 Encounters:  05/01/17 180 lb 2 oz (81.7 kg)  04/03/17 183 lb (83 kg)  03/12/17 183 lb 12.8 oz (83.4 kg)     Physical Exam  Nursing note and vitals reviewed. Constitutional: She is oriented to person, place, and time. She appears well-developed. No distress.  HENT:  Head: Normocephalic and atraumatic.  Mouth/Throat: Oropharynx is clear and moist and mucous membranes are normal.  Eyes: Conjunctivae are normal. Pupils are equal, round, and reactive to light.  Cardiovascular: Normal rate. An irregular rhythm present.  Occasional extrasystoles are present.  No murmur heard. Pulses:      Dorsalis pedis pulses are 2+ on the right side, and 2+ on the left side.  Respiratory: Effort normal and breath sounds normal. No respiratory distress.  GI: Soft. She exhibits no mass. There is no hepatomegaly. There is no tenderness.  Musculoskeletal: She exhibits no edema or tenderness.  Lymphadenopathy:    She has no cervical adenopathy.  Neurological: She is alert and oriented to person, place, and time. She has normal strength. Gait normal.  Skin: Skin is warm. No erythema.  Psychiatric: Her mood appears anxious.  Well groomed, good eye contact.      ASSESSMENT AND PLAN:   [name] was seen today for follow-up.  Diagnoses and all orders for this visit:  Essential hypertension, benign  Adequately controlled. No changes in current management. DASH-low salt diet to continue. Eye exam current. F/U in 5-6 months, before if needed.  Class 1 obesity with body mass index (BMI) of 33.0 to 33.9 in adult, unspecified obesity type, unspecified whether serious comorbidity present  We discussed benefits of wt loss as well as adverse effects of obesity. Encouraged to continue with healthy diet and physical activity.  Need for 23-polyvalent pneumococcal polysaccharide vaccine -      Pneumococcal polysaccharide vaccine 23-valent greater than or equal to 2yo subcutaneous/IM    -[name] was advised to return sooner than planned today if new concerns arise.       Buffi Ewton G. Martinique, MD  [facility]. [place] office.

## 2017-05-01 ENCOUNTER — Ambulatory Visit (INDEPENDENT_AMBULATORY_CARE_PROVIDER_SITE_OTHER): Payer: Medicare Other | Admitting: Family Medicine

## 2017-05-01 ENCOUNTER — Encounter: Payer: Self-pay | Admitting: Family Medicine

## 2017-05-01 VITALS — BP 133/84 | HR 84 | Temp 97.9°F | Resp 16 | Ht 63.0 in | Wt 180.1 lb

## 2017-05-01 DIAGNOSIS — I10 Essential (primary) hypertension: Secondary | ICD-10-CM

## 2017-05-01 DIAGNOSIS — E669 Obesity, unspecified: Secondary | ICD-10-CM

## 2017-05-01 DIAGNOSIS — Z23 Encounter for immunization: Secondary | ICD-10-CM

## 2017-05-01 DIAGNOSIS — E66811 Obesity, class 1: Secondary | ICD-10-CM

## 2017-05-01 DIAGNOSIS — Z6833 Body mass index (BMI) 33.0-33.9, adult: Secondary | ICD-10-CM | POA: Diagnosis not present

## 2017-05-01 NOTE — Patient Instructions (Addendum)
A few things to remember from today's visit:   Essential hypertension, benign  Class 1 obesity with body mass index (BMI) of 33.0 to 33.9 in adult, unspecified obesity type, unspecified whether serious comorbidity present  No changes today. Continue the good work!!   Please be sure medication list is accurate. If a new problem present, please set up appointment sooner than planned today.

## 2017-05-03 ENCOUNTER — Ambulatory Visit: Payer: Medicare Other | Admitting: Family Medicine

## 2017-12-03 ENCOUNTER — Other Ambulatory Visit: Payer: Self-pay | Admitting: Family Medicine

## 2017-12-03 ENCOUNTER — Other Ambulatory Visit: Payer: Self-pay | Admitting: *Deleted

## 2017-12-03 ENCOUNTER — Telehealth: Payer: Self-pay | Admitting: *Deleted

## 2017-12-03 DIAGNOSIS — E559 Vitamin D deficiency, unspecified: Secondary | ICD-10-CM

## 2017-12-03 DIAGNOSIS — M810 Age-related osteoporosis without current pathological fracture: Secondary | ICD-10-CM

## 2017-12-03 DIAGNOSIS — Z1231 Encounter for screening mammogram for malignant neoplasm of breast: Secondary | ICD-10-CM

## 2017-12-03 NOTE — Telephone Encounter (Signed)
Message sent to Dr. Jordan for review and approval. 

## 2017-12-03 NOTE — Telephone Encounter (Signed)
DEXA order has been already placed.  Leory Allinson Martinique, MD

## 2017-12-03 NOTE — Telephone Encounter (Signed)
Copied from [place] [phone]. Topic: Referral - Request >> Dec 03, 2017  1:00 PM Scherrie Gerlach wrote: Reason for CRM: pt states it is also time for her to have a bone density. Pt would like to have at the same time as her mammogram at the [facility]. Please put the order

## 2017-12-13 IMAGING — CR DG CHEST 2V
2 series · 2 of 2 positions shown · non-contrast
Comparison: Chest radiograph 04/28/2011

CLINICAL DATA: Left arm pain and numbness

EXAM:
CHEST  2 VIEW

[w chest pa]
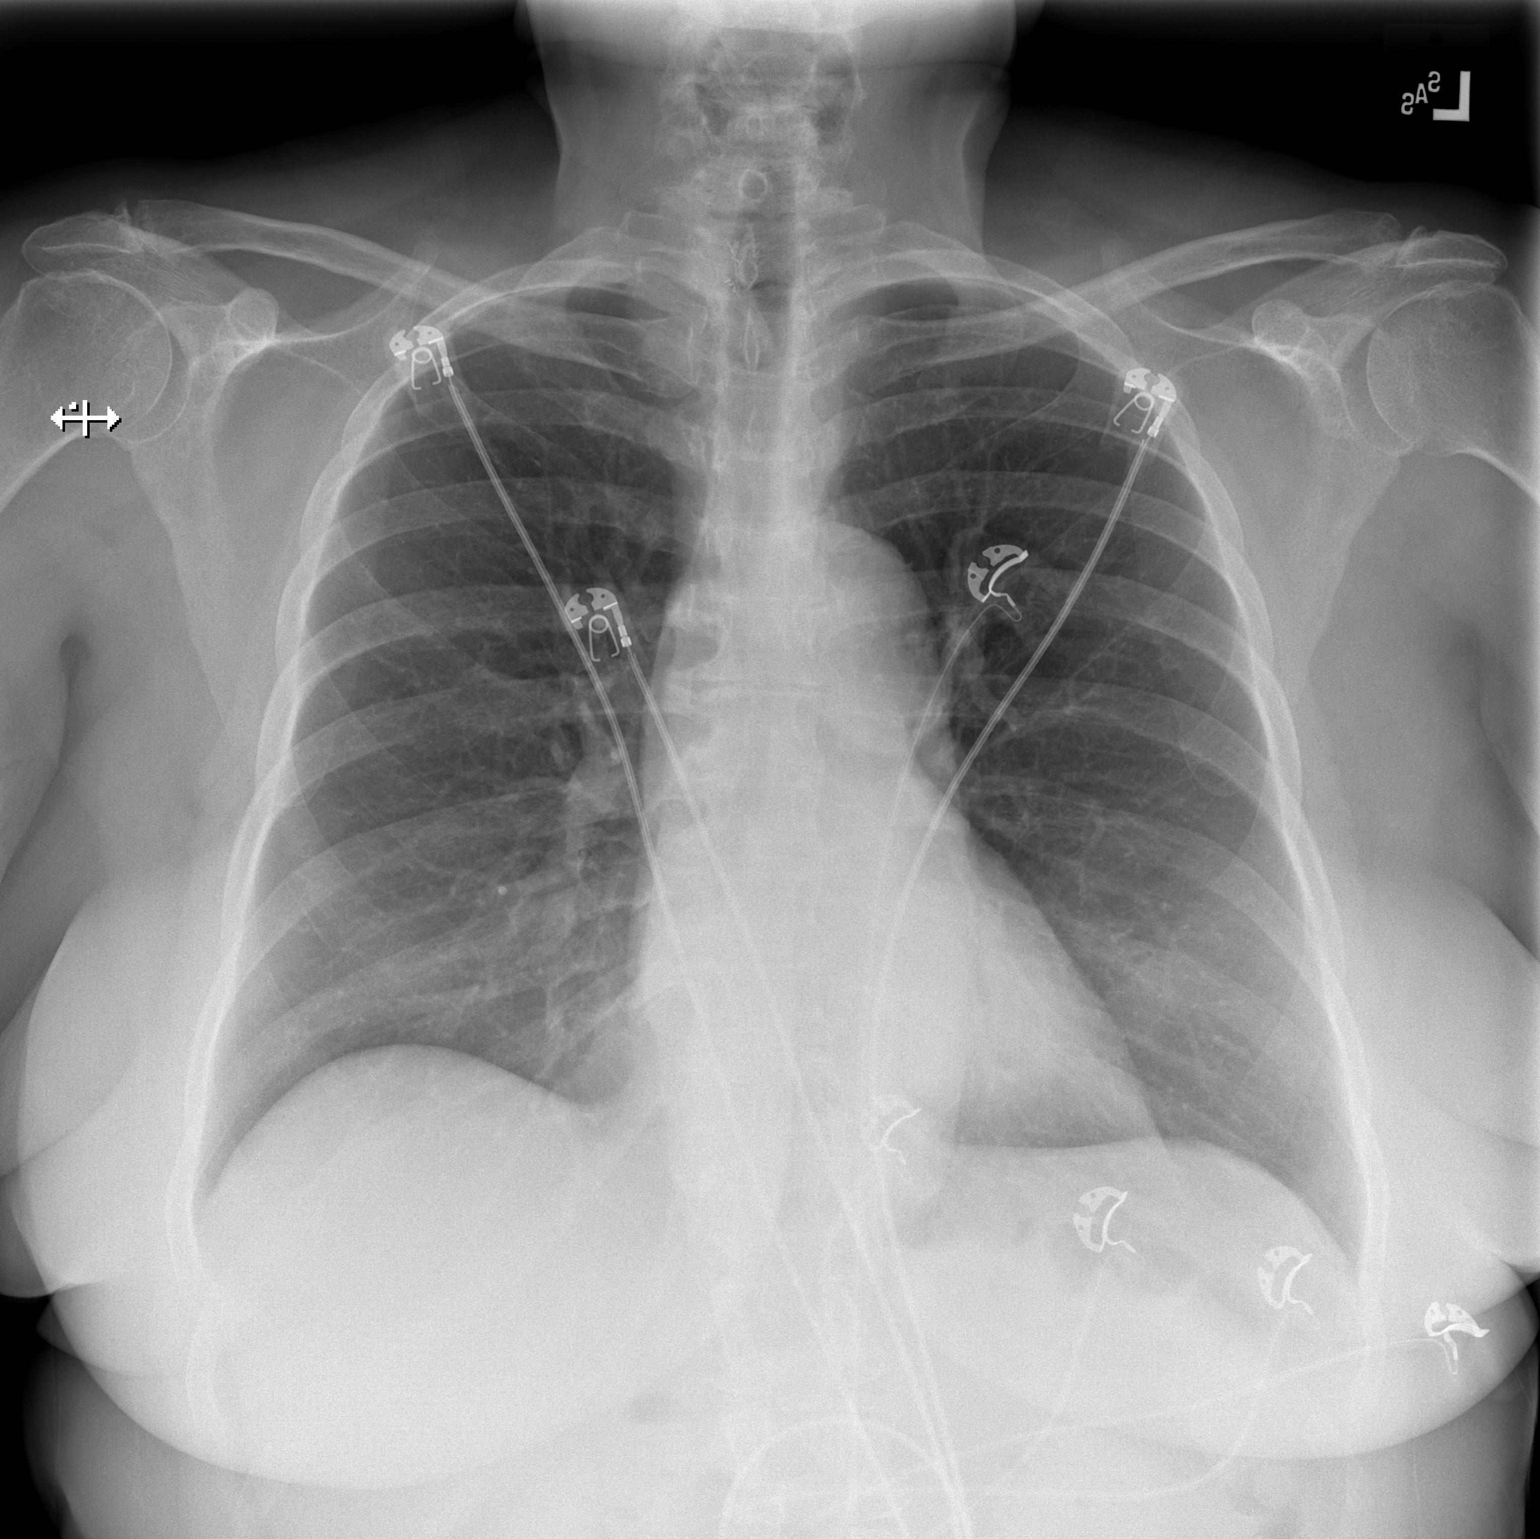

[w chest lat]
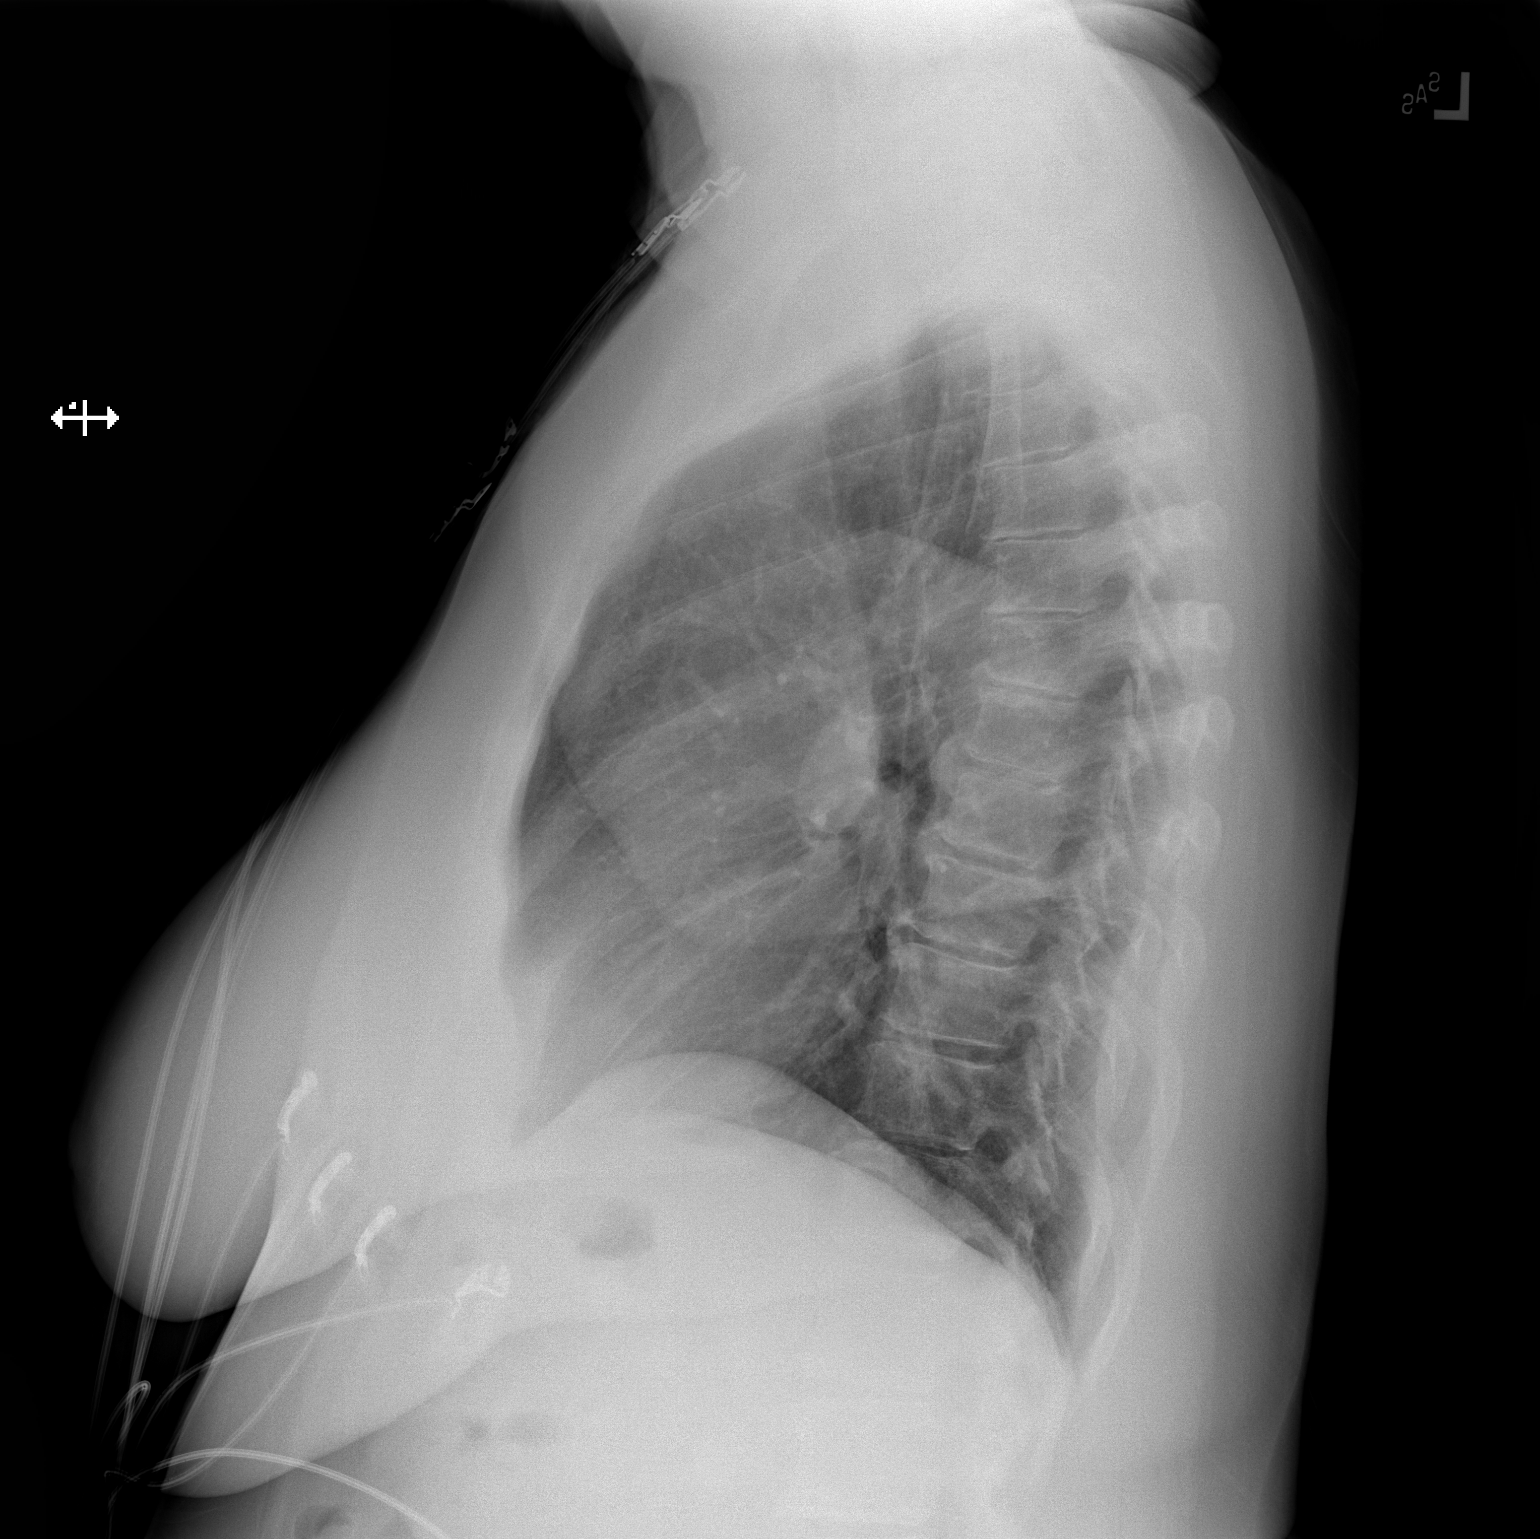

[2 of 2 positions shown; findings below may reference images not displayed]

FINDINGS: The heart size and mediastinal contours are within normal limits.
Both lungs are clear. The visualized skeletal structures are
unremarkable.
IMPRESSION: No active cardiopulmonary disease.

## 2017-12-20 ENCOUNTER — Ambulatory Visit: Payer: Medicare Other

## 2018-01-15 ENCOUNTER — Other Ambulatory Visit: Payer: Self-pay

## 2018-01-20 ENCOUNTER — Ambulatory Visit
Admission: RE | Admit: 2018-01-20 | Discharge: 2018-01-20 | Disposition: A | Discharge status: Home / Self Care | Payer: Medicare Other | Source: Ambulatory Visit | Attending: Family Medicine | Admitting: Family Medicine

## 2018-01-20 DIAGNOSIS — Z1231 Encounter for screening mammogram for malignant neoplasm of breast: Secondary | ICD-10-CM

## 2018-01-20 DIAGNOSIS — M85852 Other specified disorders of bone density and structure, left thigh: Secondary | ICD-10-CM | POA: Diagnosis not present

## 2018-01-20 DIAGNOSIS — Z780 Asymptomatic menopausal state: Secondary | ICD-10-CM | POA: Diagnosis not present

## 2018-01-20 DIAGNOSIS — M810 Age-related osteoporosis without current pathological fracture: Secondary | ICD-10-CM

## 2018-01-21 ENCOUNTER — Encounter: Payer: Self-pay | Admitting: Family Medicine

## 2018-02-04 NOTE — Progress Notes (Signed)
Subjective:   Kristen Wolf is a 67 y.o. female who presents for an Initial Medicare Annual Wellness Visit.  Reports health as good x for BP  Hx of rectal cancer   Lives alone; lives in [place] in the building   BP has been erratic  Generally walks in the am  Stresses to take her BP at home  BMI 36  Losing 10 lbs            Diet Breakfast oatmeal generally ; bowl of fruit; sausage and egg  Lunch salad, vegetables, chicken or fish Dinner likewise Drinks water   Exercise Walks 5 miles when the temperature changes Now does 3 miles  Goes to the park  Uses the app   Tobacco neg   Health Maintenance Due  Topic Date Due  . Hepatitis C Screening  November 27, 1950  . TETANUS/TDAP  06/26/1968  . INFLUENZA VACCINE  01/16/2018    Colonoscopy 05/15/16 and due 04/2021  Mammogram 01/2018  Recent bone density; osteopenia- calcium and vit D reviewed 800 to 1000  Dexa 2919  -1.6  Start taking HCTZ  States she took it today but had stopped it  Cardiac Risk Factors include: advanced age (>51mn, >>51women);family history of premature cardiovascular disease;hypertension     Objective:    Today's Vitals   02/05/18 0804 02/05/18 0844  BP: (!) 178/120 (!) 172/104  Pulse: 76   SpO2: 97%   Weight: 194 lb (88 kg)   Height: '5\' 1"'  (1.549 m)    Body mass index is 36.66 kg/m.  Advanced Directives 02/05/2018 01/05/2017 05/15/2016 04/27/2016  Does Patient Have a Medical Advance Directive? No No No No  Would patient like information on creating a medical advance directive? - No - Patient declined - -   shared [place] form and agreed to take one home  Current Medications (verified) Outpatient Encounter Medications as of 02/05/2018  Medication Sig  . amLODipine-valsartan (EXFORGE) 10-160 MG tablet Take 1 tablet by mouth daily.  . Calcium-Magnesium (CAL-MAG PO) Take by mouth every morning. Powder  . Celery Seed OIL by Does not apply route.  . cholecalciferol  (VITAMIN D) 1000 UNITS tablet Take 1,000 Units by mouth daily.   .Marland Kitchenco-enzyme Q-10 30 MG capsule Take 30 mg by mouth daily.    . fluticasone (FLONASE) 50 MCG/ACT nasal spray Place 1 spray into both nostrils daily.  . hydrochlorothiazide (MICROZIDE) 12.5 MG capsule Take 12.5 mg by mouth every morning.  . IRON PO Take by mouth 2 (two) times a week.  . Magnesium 250 MG TABS Take 250 mg by mouth daily.  .Marland KitchenPOTASSIUM PO Take by mouth daily.   No facility-administered encounter medications on file as of 02/05/2018.     Allergies (verified) Cefdinir   History: Past Medical History:  Diagnosis Date  . Allergy   . Arthritis    hands  . Cancer (HCC)    hx rectal cancer  . 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 Cyst (solitary) of breast    L breast cyst, mamogram in September "unchanged"  . Gout   . Hyperlipidemia   . Hypertension   . Kidney cysts   . Liver cyst    Past Surgical History:  Procedure Laterality Date  . ABDOMINAL HYSTERECTOMY     1993  . APPENDECTOMY     1980  . COLONOSCOPY  03/2011   Hx rectal cancer  . PARATHYROIDECTOMY     2005  . PARATHYROIDECTOMY  2006  . POLYPECTOMY    . TUBAL LIGATION     1980  . TUBAL LIGATION     Family History  Problem Relation Age of Onset  . Asthma Mother   . Hypertension Father   . Multiple myeloma Sister   . Colon cancer Neg Hx   . Esophageal cancer Neg Hx   . Stomach cancer Neg Hx    Social History   Socioeconomic History  . Marital status: Divorced    Spouse name: Not on file  . Number of children: Not on file  . Years of education: Not on file  . Highest education level: Not on file  Occupational History  . Not on file  Social Needs  . Financial resource strain: Not on file  . Food insecurity:    Worry: Not on file    Inability: Not on file  . Transportation needs:    Medical: Not on file    Non-medical: Not on file  Tobacco Use  . Smoking status: Never Smoker  . Smokeless tobacco: Never Used  Substance and  Sexual Activity  . Alcohol use: No  . Drug use: No  . Sexual activity: Not on file  Lifestyle  . Physical activity:    Days per week: Not on file    Minutes per session: Not on file  . Stress: Not on file  Relationships  . Social connections:    Talks on phone: Not on file    Gets together: Not on file    Attends religious service: Not on file    Active member of club or organization: Not on file    Attends meetings of clubs or organizations: Not on file    Relationship status: Not on file  Other Topics Concern  . Not on file  Social History Narrative  . Not on file    Tobacco Counseling Counseling given: Not Answered   Clinical Intake:    Activities of Daily Living In your present state of health, do you have any difficulty performing the following activities: 02/05/2018  Hearing? N  Vision? N  Difficulty concentrating or making decisions? N  Walking or climbing stairs? N  Dressing or bathing? N  Doing errands, shopping? N  Preparing Food and eating ? N  Using the Toilet? N  In the past six months, have you accidently leaked urine? N  Comment sometimes when she has to much magnesium  Do you have problems with loss of bowel control? N  Managing your Medications? N  Managing your Finances? N  Housekeeping or managing your Housekeeping? N  Some recent data might be hidden     Immunizations and Health Maintenance Immunization History  Administered Date(s) Administered  . Pneumococcal Polysaccharide-23 05/01/2017   Health Maintenance Due  Topic Date Due  . Hepatitis C Screening  09/10/1950  . TETANUS/TDAP  06/26/1968  . INFLUENZA VACCINE  01/16/2018    Patient Care Team: Martinique, Betty G, MD as PCP - General (Family Medicine)  Indicate any recent Medical Services you may have received from other than [facility] providers in the past year (date may be approximate).     Assessment:   This is a routine wellness examination for Kristen Wolf.  Hearing/Vision  screen Hearing Screening Comments: Hearing issues none Vision Screening Comments: On [place] checked every year   Dietary issues and exercise activities discussed: Current Exercise Habits: Home exercise routine, Type of exercise: walking, Time (Minutes): 60, Frequency (Times/Week): 5, Weekly Exercise (Minutes/Week): 300, Intensity: Moderate  Goals    . DIET - EAT MORE FRUITS AND VEGETABLES     Cut out more snacks Sometimes substitute bean salads       Depression Screen PHQ 2/9 Scores 02/05/2018 05/01/2017  PHQ - 2 Score 0 0    Fall Risk Fall Risk  02/05/2018  Falls in the past year? No  Comment one the year before last      Cognitive Function:     Ad8 score reviewed for issues:  Issues making decisions:  Less interest in hobbies / activities:  Repeats questions, stories (family complaining):  Trouble using ordinary gadgets (microwave, computer, phone):  Forgets the month or year:   Mismanaging finances:   Remembering appts:  Daily problems with thinking and/or memory: Ad8 score is=0        Screening Tests Health Maintenance  Topic Date Due  . Hepatitis C Screening  10-14-1950  . TETANUS/TDAP  06/26/1968  . INFLUENZA VACCINE  01/16/2018  . PNA vac Low Risk Adult (2 of 2 - PCV13) 05/01/2018  . MAMMOGRAM  01/21/2020  . COLONOSCOPY  05/15/2021  . DEXA SCAN  Completed         Plan:      PCP Notes   Health Maintenance Colonoscopy 05/15/16 and due 04/2021  Mammogram 01/2018   Recent bone density; osteopenia- calcium and vit D reviewed 800 to 1000  Dexa 2919  -1.6 Good exercise plan Given the osteoporosis foundation to review  Start taking HCTZ  States she took it today but had stopped it  Ordered Hep c for next blood draw   Abnormal Screens  BP elevated today 178/120 but states it "always goes up at the doctor". Prior to leaving was still 172/104 Spent time discussing HTN and risk, even when BP is up and down; has gained weight  and will try to lose 10 lbs. Diet is overall healthy and not sodium rich. Walks 3 to 5 miles qd depending on the weather.  Plan - Agrees to take her BP med at hs; as she was taking it after her early morning walks. She will take her fluid pill later on on the day. She will check her BP x 2 at home and make an apt with Dr. Martinique to fup in 2 weeks   Referrals  none  Patient concerns; Weight; BP   Nurse Concerns; As noted  Next PCP apt 9/4 @ 8: 30     I have personally reviewed and noted the following in the patient's chart:   . Medical and social history . Use of alcohol, tobacco or illicit drugs  . Current medications and supplements . Functional ability and status . Nutritional status . Physical activity . Advanced directives . List of other physicians . Hospitalizations, surgeries, and ER visits in previous 12 months . Vitals . Screenings to include cognitive, depression, and falls . Referrals and appointments  In addition, I have reviewed and discussed with patient certain preventive protocols, quality metrics, and best practice recommendations. A written personalized care plan for preventive services as well as general preventive health recommendations were provided to patient.     Wynetta Fines, RN   02/05/2018

## 2018-02-05 ENCOUNTER — Ambulatory Visit (INDEPENDENT_AMBULATORY_CARE_PROVIDER_SITE_OTHER): Payer: Medicare Other

## 2018-02-05 ENCOUNTER — Ambulatory Visit (INDEPENDENT_AMBULATORY_CARE_PROVIDER_SITE_OTHER): Payer: Medicare Other | Admitting: Family Medicine

## 2018-02-05 ENCOUNTER — Encounter: Payer: Self-pay | Admitting: Family Medicine

## 2018-02-05 VITALS — BP 172/104 | HR 76 | Ht 61.0 in | Wt 194.0 lb

## 2018-02-05 VITALS — BP 210/110 | HR 96 | Temp 98.4°F | Resp 16 | Ht 61.0 in | Wt 194.2 lb

## 2018-02-05 DIAGNOSIS — Z6833 Body mass index (BMI) 33.0-33.9, adult: Secondary | ICD-10-CM

## 2018-02-05 DIAGNOSIS — Z0000 Encounter for general adult medical examination without abnormal findings: Secondary | ICD-10-CM

## 2018-02-05 DIAGNOSIS — E669 Obesity, unspecified: Secondary | ICD-10-CM | POA: Diagnosis not present

## 2018-02-05 DIAGNOSIS — I10 Essential (primary) hypertension: Secondary | ICD-10-CM | POA: Diagnosis not present

## 2018-02-05 DIAGNOSIS — Z1159 Encounter for screening for other viral diseases: Secondary | ICD-10-CM

## 2018-02-05 LAB — BASIC METABOLIC PANEL
BUN: 16 mg/dL (ref 6–23)
CO2: 28 meq/L (ref 19–32)
Calcium: 10 mg/dL (ref 8.4–10.5)
Chloride: 101 mEq/L (ref 96–112)
Creatinine, Ser: 1.1 mg/dL (ref 0.40–1.20)
GFR: 63.41 mL/min (ref >60.00)
Glucose, Bld: 106 mg/dL — ABNORMAL HIGH (ref 70–99)
Potassium: 3.3 mEq/L — ABNORMAL LOW (ref 3.5–5.1)
SODIUM: 138 meq/L (ref 135–145)

## 2018-02-05 NOTE — Patient Instructions (Signed)
A few things to remember from today's visit:   Essential hypertension, benign - Plan: Basic metabolic panel  In a week blood pressure numbers. Resume medication.   Please be sure medication list is accurate. If a new problem present, please set up appointment sooner than planned today.

## 2018-02-05 NOTE — Progress Notes (Signed)
[name] is a 67 y.o.female, who is here today to follow on HTN. This morning she had her Medicare preventive visit and BP was 222/120.  She is not checking BP at home. She does continue medications for a while because she lost weight, a few days ago she resume medication about half of the dose.  She is on HCTZ 12.5 mg daily as needed for lower extremity edema but she has not been taking it because facility is helping with this problem. She is supposed to be on amlodipine-losartan 10-160 mg daily.   She was feeling lightheaded when taking the whole dose but she was taking it on empty stomach and then going for a walk.   She has not noted unusual headache, visual changes, exertional chest pain, dyspnea, or focal weakness.    Lab Results  Component Value Date   CREATININE 1.09 01/28/2017   BUN 12 01/28/2017   NA 138 01/28/2017   K 3.7 01/28/2017   CL 100 01/28/2017   CO2 27 01/28/2017    She has been walking 3 miles daily and watching her diet.  Initially she lost weight but it seems like she is gaining some back. She thinks her BP is elevated because she gained weight.  She has not noted gross hematuria, foamy urine, or decreased urine output.  Review of Systems  Constitutional: Negative for activity change, appetite change, fatigue and fever.  HENT: Negative for mouth sores, nosebleeds and trouble swallowing.   Eyes: Negative for redness and visual disturbance.  Respiratory: Negative for cough, shortness of breath and wheezing.   Cardiovascular: Negative for chest pain, palpitations and leg swelling.  Gastrointestinal: Negative for abdominal pain, nausea and vomiting.       Negative for changes in bowel habits.  Genitourinary: Negative for decreased urine volume, dysuria and hematuria.  Neurological: Negative for syncope, weakness, numbness and headaches.     Current Outpatient Medications on File Prior to Visit  Medication Sig Dispense Refill  .  amLODipine-valsartan (EXFORGE) 10-160 MG tablet Take 1 tablet by mouth daily.    . Calcium-Magnesium (CAL-MAG PO) Take by mouth every morning. Powder    . Celery Seed OIL by Does not apply route.    . cholecalciferol (VITAMIN D) 1000 UNITS tablet Take 1,000 Units by mouth daily.     Marland Kitchen co-enzyme Q-10 30 MG capsule Take 30 mg by mouth daily.      . fluticasone (FLONASE) 50 MCG/ACT nasal spray Place 1 spray into both nostrils daily.    . hydrochlorothiazide (MICROZIDE) 12.5 MG capsule Take 12.5 mg by mouth every morning.    . IRON PO Take by mouth 2 (two) times a week.    . Magnesium 250 MG TABS Take 250 mg by mouth daily.    Marland Kitchen POTASSIUM PO Take by mouth daily.     No current facility-administered medications on file prior to visit.      Past Medical History:  Diagnosis Date  . Allergy   . Arthritis    hands  . Cancer (HCC)    hx rectal cancer  . 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 Cyst (solitary) of breast    L breast cyst, mamogram in September "unchanged"  . Gout   . Hyperlipidemia   . Hypertension   . Kidney cysts   . Liver cyst     Allergies  Allergen Reactions  . Cefdinir Diarrhea    Social History   Socioeconomic History  .  Marital status: Divorced    Spouse name: Not on file  . Number of children: Not on file  . Years of education: Not on file  . Highest education level: Not on file  Occupational History  . Not on file  Social Needs  . Financial resource strain: Not on file  . Food insecurity:    Worry: Not on file    Inability: Not on file  . Transportation needs:    Medical: Not on file    Non-medical: Not on file  Tobacco Use  . Smoking status: Never Smoker  . Smokeless tobacco: Never Used  Substance and Sexual Activity  . Alcohol use: No  . Drug use: No  . Sexual activity: Not on file  Lifestyle  . Physical activity:    Days per week: Not on file    Minutes per session: Not on file  . Stress: Not on file  Relationships  . Social connections:     Talks on phone: Not on file    Gets together: Not on file    Attends religious service: Not on file    Active member of club or organization: Not on file    Attends meetings of clubs or organizations: Not on file    Relationship status: Not on file  Other Topics Concern  . Not on file  Social History Narrative  . Not on file    Vitals:   02/05/18 1342 02/05/18 1415  BP: 140/84 (!) 210/110  Pulse: 96   Resp: 16   Temp: 98.4 F (36.9 C)   SpO2: 98%    Body mass index is 36.7 kg/m.    Physical Exam  Nursing note and vitals reviewed. Constitutional: She is oriented to person, place, and time. She appears well-developed. No distress.  HENT:  Head: Normocephalic and atraumatic.  Mouth/Throat: Oropharynx is clear and moist and mucous membranes are normal.  Eyes: Pupils are equal, round, and reactive to light. Conjunctivae are normal.  Cardiovascular: Normal rate and regular rhythm.  No murmur heard. Pulses:      Dorsalis pedis pulses are 2+ on the right side, and 2+ on the left side.  Respiratory: Effort normal and breath sounds normal. No respiratory distress.  GI: Soft. She exhibits no mass. There is no hepatomegaly. There is no tenderness.  Musculoskeletal: She exhibits no edema.  Lymphadenopathy:    She has no cervical adenopathy.  Neurological: She is alert and oriented to person, place, and time. She has normal strength. No cranial nerve deficit. Gait normal.  Skin: Skin is warm. No rash noted. No erythema.  Psychiatric: She has a normal mood and affect.  Well groomed, good eye contact.    ASSESSMENT AND PLAN:   [name] was seen today for hypertension.  Orders Placed This Encounter  Procedures  . Basic metabolic panel   Lab Results  Component Value Date   CREATININE 1.10 02/05/2018   BUN 16 02/05/2018   NA 138 02/05/2018   K 3.3 (L) 02/05/2018   CL 101 02/05/2018   CO2 28 02/05/2018     Essential hypertension, benign Poorly controlled. Possible  complications of elevated BP discussed. We discussed facial pathology and prognosis of hypertension. She was instructed to resume Exforge 10-160 mg, she can take medication at bedtime. She will monitor BP at home, instructed to let me know the numbers in about a week.  We discussed the importance of compliance with treatment. Clearly instructed about warning signs. I will see her back  in 2 months, before if needed.   Class 1 obesity with body mass index (BMI) of 33.0 to 33.9 in adult We discussed benefits of wt loss as well as adverse effects of obesity. She will continue healthy diet and daily physical activity.      Grady Mohabir G. Martinique, MD  [facility]. [place] office.

## 2018-02-05 NOTE — Patient Instructions (Addendum)
Kristen Wolf , Thank you for taking time to come for your Medicare Wellness Visit. I appreciate your ongoing commitment to your health goals. Please review the following plan we discussed and let me know if I can assist you in the future.   Please make an apt with Dr. Martinique to recheck bp x 2 weeks and draw labs as appropriate  Take your BP once or twice in between    Shingrix is a vaccine for the prevention of Shingles in Adults 50 and older.  If you are on Medicare, the shingrix is covered under your Part D plan, so you will take both of the vaccines in the series at your pharmacy. Please check with your benefits regarding applicable copays or out of pocket expenses.  The Shingrix is given in 2 vaccines approx 8 weeks apart. You must receive the 2nd dose prior to 6 months from receipt of the first. Please have the pharmacist print out you Immunization  dates for our office records   Will try to complete AD; Given copy  Referred to [facility] for questions [facility] offers free advance directive forms, as well as assistance in completing the forms themselves. For assistance, contact the Spiritual Care Department at [phone], or the Clinical Social Work Department at [phone].    A Tetanus is recommended every 10 years. Medicare covers a tetanus if you have a cut or wound; otherwise, there may be a charge. If you had not had a tetanus with pertusses, known as the Tdap, you can take this anytime.    Medicare now request all "baby boomers" test for possible exposure to Hepatitis C. Many may have been exposed due to dental work, tatoo's, vaccinations when young. The Hepatitis C virus is dormant for many years and then sometimes will cause liver cancer. If you gave blood in the past 15 years, you were most likely checked for Hep C. If you rec'd blood; you may want to consider testing or if you are high risk for any other reason.    Recommendations for Dexa Scan Female over the age of  73 Man age 24 or older If you broke a bone past the age of 84 Women menopausal age with risk factors (thin frame; smoker; hx of fx ) Post menopausal women under the age of 29 with risk factors A man age 80 to 70 with risk factors Other: Spine xray that is showing break of bone loss Back pain with possible break Height loss of 1/2 inch or more within one year Total loss in height of 1.5 inches from your original height  Calcium '1200mg'$  with Vit D 800u per day; more as directed by physician Strength building exercises discussed; can include walking; housework; small weights or stretch bands; silver sneakers if access to the [facility]  Please visit the osteoporosis foundation.org for up to date recommendations    These are the goals we discussed: Goals    . DIET - EAT MORE FRUITS AND VEGETABLES     Cut out more snacks Sometimes substitute bean salads        This is a list of the screening recommended for you and due dates:  Health Maintenance  Topic Date Due  .  Hepatitis C: One time screening is recommended by Center for Disease Control  (CDC) for  adults born from [birth year] through 1965.   1951/02/18  . Tetanus Vaccine  06/26/1968  . Flu Shot  01/16/2018  . Pneumonia vaccines (2 of 2 - PCV13) 05/01/2018  .  Mammogram  01/21/2020  . Colon Cancer Screening  05/15/2021  . DEXA scan (bone density measurement)  Completed      Fall Prevention in the Home Falls can cause injuries. They can happen to people of all ages. There are many things you can do to make your home safe and to help prevent falls. What can I do on the outside of my home?  Regularly fix the edges of walkways and driveways and fix any cracks.  Remove anything that might make you trip as you walk through a door, such as a raised step or threshold.  Trim any bushes or trees on the path to your home.  Use bright outdoor lighting.  Clear any walking paths of anything that might make someone trip, such as rocks or  tools.  Regularly check to see if handrails are loose or broken. Make sure that both sides of any steps have handrails.  Any raised decks and porches should have guardrails on the edges.  Have any leaves, snow, or ice cleared regularly.  Use sand or salt on walking paths during winter.  Clean up any spills in your garage right away. This includes oil or grease spills. What can I do in the bathroom?  Use night lights.  Install grab bars by the toilet and in the tub and shower. Do not use towel bars as grab bars.  Use non-skid mats or decals in the tub or shower.  If you need to sit down in the shower, use a plastic, non-slip stool.  Keep the floor dry. Clean up any water that spills on the floor as soon as it happens.  Remove soap buildup in the tub or shower regularly.  Attach bath mats securely with double-sided non-slip rug tape.  Do not have throw rugs and other things on the floor that can make you trip. What can I do in the bedroom?  Use night lights.  Make sure that you have a light by your bed that is easy to reach.  Do not use any sheets or blankets that are too big for your bed. They should not hang down onto the floor.  Have a firm chair that has side arms. You can use this for support while you get dressed.  Do not have throw rugs and other things on the floor that can make you trip. What can I do in the kitchen?  Clean up any spills right away.  Avoid walking on wet floors.  Keep items that you use a lot in easy-to-reach places.  If you need to reach something above you, use a strong step stool that has a grab bar.  Keep electrical cords out of the way.  Do not use floor polish or wax that makes floors slippery. If you must use wax, use non-skid floor wax.  Do not have throw rugs and other things on the floor that can make you trip. What can I do with my stairs?  Do not leave any items on the stairs.  Make sure that there are handrails on both  sides of the stairs and use them. Fix handrails that are broken or loose. Make sure that handrails are as long as the stairways.  Check any carpeting to make sure that it is firmly attached to the stairs. Fix any carpet that is loose or worn.  Avoid having throw rugs at the top or bottom of the stairs. If you do have throw rugs, attach them to the floor with carpet  tape.  Make sure that you have a light switch at the top of the stairs and the bottom of the stairs. If you do not have them, ask someone to add them for you. What else can I do to help prevent falls?  Wear shoes that: ? Do not have high heels. ? Have rubber bottoms. ? Are comfortable and fit you well. ? Are closed at the toe. Do not wear sandals.  If you use a stepladder: ? Make sure that it is fully opened. Do not climb a closed stepladder. ? Make sure that both sides of the stepladder are locked into place. ? Ask someone to hold it for you, if possible.  Clearly mark and make sure that you can see: ? Any grab bars or handrails. ? First and last steps. ? Where the edge of each step is.  Use tools that help you move around (mobility aids) if they are needed. These include: ? Canes. ? Walkers. ? Scooters. ? Crutches.  Turn on the lights when you go into a dark area. Replace any light bulbs as soon as they burn out.  Set up your furniture so you have a clear path. Avoid moving your furniture around.  If any of your floors are uneven, fix them.  If there are any pets around you, be aware of where they are.  Review your medicines with your doctor. Some medicines can make you feel dizzy. This can increase your chance of falling. Ask your doctor what other things that you can do to help prevent falls. This information is not intended to replace advice given to you by your health care provider. Make sure you discuss any questions you have with your health care provider. Document Released: 03/31/2009 Document Revised:  11/10/2015 Document Reviewed: 07/09/2014 Elsevier Interactive Patient Education  2018 [place] Maintenance, Female Adopting a healthy lifestyle and getting preventive care can go a long way to promote health and wellness. Talk with your health care provider about what schedule of regular examinations is right for you. This is a good chance for you to check in with your provider about disease prevention and staying healthy. In between checkups, there are plenty of things you can do on your own. Experts have done a lot of research about which lifestyle changes and preventive measures are most likely to keep you healthy. Ask your health care provider for more information. Weight and diet Eat a healthy diet  Be sure to include plenty of vegetables, fruits, low-fat dairy products, and lean protein.  Do not eat a lot of foods high in solid fats, added sugars, or salt.  Get regular exercise. This is one of the most important things you can do for your health. ? Most adults should exercise for at least 150 minutes each week. The exercise should increase your heart rate and make you sweat (moderate-intensity exercise). ? Most adults should also do strengthening exercises at least twice a week. This is in addition to the moderate-intensity exercise.  Maintain a healthy weight  Body mass index (BMI) is a measurement that can be used to identify possible weight problems. It estimates body fat based on height and weight. Your health care provider can help determine your BMI and help you achieve or maintain a healthy weight.  For females 77 years of age and older: ? A BMI below 18.5 is considered underweight. ? A BMI of 18.5 to 24.9 is normal. ? A BMI of 25 to 29.9 is  considered overweight. ? A BMI of 30 and above is considered obese.  Watch levels of cholesterol and blood lipids  You should start having your blood tested for lipids and cholesterol at 69 years of age, then have this test  every 5 years.  You may need to have your cholesterol levels checked more often if: ? Your lipid or cholesterol levels are high. ? You are older than 68 years of age. ? You are at high risk for heart disease.  Cancer screening Lung Cancer  Lung cancer screening is recommended for adults 8-72 years old who are at high risk for lung cancer because of a history of smoking.  A yearly low-dose CT scan of the lungs is recommended for people who: ? Currently smoke. ? Have quit within the past 15 years. ? Have at least a 30-pack-year history of smoking. A pack year is smoking an average of one pack of cigarettes a day for 1 year.  Yearly screening should continue until it has been 15 years since you quit.  Yearly screening should stop if you develop a health problem that would prevent you from having lung cancer treatment.  Breast Cancer  Practice breast self-awareness. This means understanding how your breasts normally appear and feel.  It also means doing regular breast self-exams. Let your health care provider know about any changes, no matter how small.  If you are in your 20s or 30s, you should have a clinical breast exam (CBE) by a health care provider every 1-3 years as part of a regular health exam.  If you are 28 or older, have a CBE every year. Also consider having a breast X-ray (mammogram) every year.  If you have a family history of breast cancer, talk to your health care provider about genetic screening.  If you are at high risk for breast cancer, talk to your health care provider about having an MRI and a mammogram every year.  Breast cancer gene (BRCA) assessment is recommended for women who have family members with BRCA-related cancers. BRCA-related cancers include: ? Breast. ? Ovarian. ? Tubal. ? Peritoneal cancers.  Results of the assessment will determine the need for genetic counseling and BRCA1 and BRCA2 testing.  Cervical Cancer Your health care provider  may recommend that you be screened regularly for cancer of the pelvic organs (ovaries, uterus, and vagina). This screening involves a pelvic examination, including checking for microscopic changes to the surface of your cervix (Pap test). You may be encouraged to have this screening done every 3 years, beginning at age 39.  For women ages 57-65, health care providers may recommend pelvic exams and Pap testing every 3 years, or they may recommend the Pap and pelvic exam, combined with testing for human papilloma virus (HPV), every 5 years. Some types of HPV increase your risk of cervical cancer. Testing for HPV may also be done on women of any age with unclear Pap test results.  Other health care providers may not recommend any screening for nonpregnant women who are considered low risk for pelvic cancer and who do not have symptoms. Ask your health care provider if a screening pelvic exam is right for you.  If you have had past treatment for cervical cancer or a condition that could lead to cancer, you need Pap tests and screening for cancer for at least 20 years after your treatment. If Pap tests have been discontinued, your risk factors (such as having a new sexual partner) need to be reassessed  to determine if screening should resume. Some women have medical problems that increase the chance of getting cervical cancer. In these cases, your health care provider may recommend more frequent screening and Pap tests.  Colorectal Cancer  This type of cancer can be detected and often prevented.  Routine colorectal cancer screening usually begins at 68 years of age and continues through 68 years of age.  Your health care provider may recommend screening at an earlier age if you have risk factors for colon cancer.  Your health care provider may also recommend using home test kits to check for hidden blood in the stool.  A small camera at the end of a tube can be used to examine your colon directly  (sigmoidoscopy or colonoscopy). This is done to check for the earliest forms of colorectal cancer.  Routine screening usually begins at age 85.  Direct examination of the colon should be repeated every 5-10 years through 68 years of age. However, you may need to be screened more often if early forms of precancerous polyps or small growths are found.  Skin Cancer  Check your skin from head to toe regularly.  Tell your health care provider about any new moles or changes in moles, especially if there is a change in a mole's shape or color.  Also tell your health care provider if you have a mole that is larger than the size of a pencil eraser.  Always use sunscreen. Apply sunscreen liberally and repeatedly throughout the day.  Protect yourself by wearing long sleeves, pants, a wide-brimmed hat, and sunglasses whenever you are outside.  Heart disease, diabetes, and high blood pressure  High blood pressure causes heart disease and increases the risk of stroke. High blood pressure is more likely to develop in: ? People who have blood pressure in the high end of the normal range (130-139/85-89 mm Hg). ? People who are overweight or obese. ? People who are African American.  If you are 69-72 years of age, have your blood pressure checked every 3-5 years. If you are 57 years of age or older, have your blood pressure checked every year. You should have your blood pressure measured twice-once when you are at a hospital or clinic, and once when you are not at a hospital or clinic. Record the average of the two measurements. To check your blood pressure when you are not at a hospital or clinic, you can use: ? An automated blood pressure machine at a pharmacy. ? A home blood pressure monitor.  If you are between 51 years and 84 years old, ask your health care provider if you should take aspirin to prevent strokes.  Have regular diabetes screenings. This involves taking a blood sample to check your  fasting blood sugar level. ? If you are at a normal weight and have a low risk for diabetes, have this test once every three years after 68 years of age. ? If you are overweight and have a high risk for diabetes, consider being tested at a younger age or more often. Preventing infection Hepatitis B  If you have a higher risk for hepatitis B, you should be screened for this virus. You are considered at high risk for hepatitis B if: ? You were born in a country where hepatitis B is common. Ask your health care provider which countries are considered high risk. ? Your parents were born in a high-risk country, and you have not been immunized against hepatitis B (hepatitis B vaccine). ?  You have HIV or AIDS. ? You use needles to inject street drugs. ? You live with someone who has hepatitis B. ? You have had sex with someone who has hepatitis B. ? You get hemodialysis treatment. ? You take certain medicines for conditions, including cancer, organ transplantation, and autoimmune conditions.  Hepatitis C  Blood testing is recommended for: ? Everyone born from [birth year] through 1965. ? Anyone with known risk factors for hepatitis C.  Sexually transmitted infections (STIs)  You should be screened for sexually transmitted infections (STIs) including gonorrhea and chlamydia if: ? You are sexually active and are younger than 68 years of age. ? You are older than 68 years of age and your health care provider tells you that you are at risk for this type of infection. ? Your sexual activity has changed since you were last screened and you are at an increased risk for chlamydia or gonorrhea. Ask your health care provider if you are at risk.  If you do not have HIV, but are at risk, it may be recommended that you take a prescription medicine daily to prevent HIV infection. This is called pre-exposure prophylaxis (PrEP). You are considered at risk if: ? You are sexually active and do not regularly use condoms  or know the HIV status of your partner(s). ? You take drugs by injection. ? You are sexually active with a partner who has HIV.  Talk with your health care provider about whether you are at high risk of being infected with HIV. If you choose to begin PrEP, you should first be tested for HIV. You should then be tested every 3 months for as long as you are taking PrEP. Pregnancy  If you are premenopausal and you may become pregnant, ask your health care provider about preconception counseling.  If you may become pregnant, take 400 to 800 micrograms (mcg) of folic acid every day.  If you want to prevent pregnancy, talk to your health care provider about birth control (contraception). Osteoporosis and menopause  Osteoporosis is a disease in which the bones lose minerals and strength with aging. This can result in serious bone fractures. Your risk for osteoporosis can be identified using a bone density scan.  If you are 26 years of age or older, or if you are at risk for osteoporosis and fractures, ask your health care provider if you should be screened.  Ask your health care provider whether you should take a calcium or vitamin D supplement to lower your risk for osteoporosis.  Menopause may have certain physical symptoms and risks.  Hormone replacement therapy may reduce some of these symptoms and risks. Talk to your health care provider about whether hormone replacement therapy is right for you. Follow these instructions at home:  Schedule regular health, dental, and eye exams.  Stay current with your immunizations.  Do not use any tobacco products including cigarettes, chewing tobacco, or electronic cigarettes.  If you are pregnant, do not drink alcohol.  If you are breastfeeding, limit how much and how often you drink alcohol.  Limit alcohol intake to no more than 1 drink per day for nonpregnant women. One drink equals 12 ounces of beer, 5 ounces of wine, or 1 ounces of hard  liquor.  Do not use street drugs.  Do not share needles.  Ask your health care provider for help if you need support or information about quitting drugs.  Tell your health care provider if you often feel depressed.  Tell your  health care provider if you have ever been abused or do not feel safe at home. This information is not intended to replace advice given to you by your health care provider. Make sure you discuss any questions you have with your health care provider. Document Released: 12/18/2010 Document Revised: 11/10/2015 Document Reviewed: 03/08/2015 Elsevier Interactive Patient Education  [employer].

## 2018-02-05 NOTE — Assessment & Plan Note (Signed)
We discussed benefits of wt loss as well as adverse effects of obesity. She will continue healthy diet and daily physical activity.

## 2018-02-05 NOTE — Assessment & Plan Note (Addendum)
Poorly controlled. Possible complications of elevated BP discussed. We discussed facial pathology and prognosis of hypertension. She was instructed to resume Exforge 10-160 mg, she can take medication at bedtime. She will monitor BP at home, instructed to let me know the numbers in about a week.  We discussed the importance of compliance with treatment. Clearly instructed about warning signs. I will see her back in 2 months, before if needed.

## 2018-02-08 ENCOUNTER — Encounter: Payer: Self-pay | Admitting: Family Medicine

## 2018-02-10 NOTE — Progress Notes (Signed)
I have reviewed documentation from this visit and I agree with recommendations given.  Dejah Droessler G. Lexxi Koslow, MD  [facility]. [place] office.   

## 2018-02-18 DIAGNOSIS — H2513 Age-related nuclear cataract, bilateral: Secondary | ICD-10-CM | POA: Diagnosis not present

## 2018-02-18 DIAGNOSIS — H35033 Hypertensive retinopathy, bilateral: Secondary | ICD-10-CM | POA: Diagnosis not present

## 2018-02-18 DIAGNOSIS — I10 Essential (primary) hypertension: Secondary | ICD-10-CM | POA: Diagnosis not present

## 2018-02-18 DIAGNOSIS — H52223 Regular astigmatism, bilateral: Secondary | ICD-10-CM | POA: Diagnosis not present

## 2018-02-18 DIAGNOSIS — H5203 Hypermetropia, bilateral: Secondary | ICD-10-CM | POA: Diagnosis not present

## 2018-02-18 DIAGNOSIS — H524 Presbyopia: Secondary | ICD-10-CM | POA: Diagnosis not present

## 2018-02-19 ENCOUNTER — Encounter: Payer: Self-pay | Admitting: Family Medicine

## 2018-02-19 ENCOUNTER — Ambulatory Visit (INDEPENDENT_AMBULATORY_CARE_PROVIDER_SITE_OTHER): Payer: Medicare Other | Admitting: Family Medicine

## 2018-02-19 VITALS — BP 140/80 | HR 79 | Temp 98.0°F | Resp 16 | Ht 61.0 in | Wt 196.4 lb

## 2018-02-19 DIAGNOSIS — E876 Hypokalemia: Secondary | ICD-10-CM

## 2018-02-19 DIAGNOSIS — I10 Essential (primary) hypertension: Secondary | ICD-10-CM

## 2018-02-19 DIAGNOSIS — Z6837 Body mass index (BMI) 37.0-37.9, adult: Secondary | ICD-10-CM | POA: Diagnosis not present

## 2018-02-19 DIAGNOSIS — E785 Hyperlipidemia, unspecified: Secondary | ICD-10-CM | POA: Diagnosis not present

## 2018-02-19 LAB — POTASSIUM: POTASSIUM: 4 meq/L (ref 3.5–5.1)

## 2018-02-19 MED ORDER — AMLODIPINE BESYLATE-VALSARTAN 10-160 MG PO TABS: 1.0000 | ORAL_TABLET | Freq: Every day | ORAL | 2 refills | Status: DC

## 2018-02-19 MED ORDER — HYDROCHLOROTHIAZIDE 12.5 MG PO CAPS: 12.5000 mg | ORAL_CAPSULE | ORAL | 2 refills | Status: DC

## 2018-02-19 NOTE — Progress Notes (Signed)
Kristen Wolf is a 67 y.o.female, who is here today to follow on HTN.  Currently on HCTZ 12.5 mg daily and Exforge 10-160 mg daily.. She is taking medications as instructed, no side effects reported.  She has not noted unusual headache, visual changes, exertional chest pain, dyspnea,  focal weakness, or worsening edema.  She brings BP readings : Most < 140/90, occasional SBP 140's.   Lab Results  Component Value Date   CREATININE 1.10 02/05/2018   BUN 16 02/05/2018   NA 138 02/05/2018   K 3.3 (L) 02/05/2018   CL 101 02/05/2018   CO2 28 02/05/2018   HypoK+: She increased potassium containing food intake. She has not noted palpitations or muscle cramps.  Hyperlipidemia: According to patient, she has tried many statin medications, they all cause myalgias and arthralgias. Last lipid panel in 10/2016: TC 289, LDL 203, HDL 47, TG 193. She is following low-fat diet.   She has been trying to walk a few times per week and following a healthy diet.   Review of Systems  Constitutional: Negative for activity change, appetite change, fatigue and fever.  HENT: Negative for mouth sores, nosebleeds and trouble swallowing.   Eyes: Negative for redness and visual disturbance.  Respiratory: Negative for cough, shortness of breath and wheezing.   Cardiovascular: Positive for leg swelling (Occasionally, at the end of the day). Negative for chest pain and palpitations.  Gastrointestinal: Negative for abdominal pain, nausea and vomiting.       Negative for changes in bowel habits.  Genitourinary: Negative for decreased urine volume and hematuria.  Musculoskeletal: Negative for gait problem and myalgias.  Neurological: Negative for syncope, weakness and headaches.     Current Outpatient Medications on File Prior to Visit  Medication Sig Dispense Refill  . Calcium-Magnesium (CAL-MAG PO) Take by mouth every morning. Powder    . Celery Seed OIL by Does not apply route.    .  cholecalciferol (VITAMIN D) 1000 UNITS tablet Take 1,000 Units by mouth daily.     Marland Kitchen co-enzyme Q-10 30 MG capsule Take 30 mg by mouth daily.      . fluticasone (FLONASE) 50 MCG/ACT nasal spray Place 1 spray into both nostrils daily.    . IRON PO Take by mouth 2 (two) times a week.    . Magnesium 250 MG TABS Take 250 mg by mouth daily.    Marland Kitchen POTASSIUM PO Take by mouth daily.     No current facility-administered medications on file prior to visit.      Past Medical History:  Diagnosis Date  . Allergy   . Arthritis    hands  . Cancer (HCC)    hx rectal cancer  . 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 Cyst (solitary) of breast    L breast cyst, mamogram in September "unchanged"  . Gout   . Hyperlipidemia   . Hypertension   . Kidney cysts   . Liver cyst     Allergies  Allergen Reactions  . Cefdinir Diarrhea    Social History   Socioeconomic History  . Marital status: Divorced    Spouse name: Not on file  . Number of children: Not on file  . Years of education: Not on file  . Highest education level: Not on file  Occupational History  . Not on file  Social Needs  . Financial resource strain: Not on file  . Food insecurity:    Worry: Not on file  Inability: Not on file  . Transportation needs:    Medical: Not on file    Non-medical: Not on file  Tobacco Use  . Smoking status: Never Smoker  . Smokeless tobacco: Never Used  Substance and Sexual Activity  . Alcohol use: No  . Drug use: No  . Sexual activity: Not on file  Lifestyle  . Physical activity:    Days per week: Not on file    Minutes per session: Not on file  . Stress: Not on file  Relationships  . Social connections:    Talks on phone: Not on file    Gets together: Not on file    Attends religious service: Not on file    Active member of club or organization: Not on file    Attends meetings of clubs or organizations: Not on file    Relationship status: Not on file  Other Topics Concern  . Not on  file  Social History Narrative  . Not on file    Vitals:   02/19/18 0826  BP: 140/80  Pulse: 79  Resp: 16  Temp: 98 F (36.7 C)  SpO2: 98%   Body mass index is 37.1 kg/m. Wt Readings from Last 3 Encounters:  02/19/18 196 lb 6 oz (89.1 kg)  02/05/18 194 lb 4 oz (88.1 kg)  02/05/18 194 lb (88 kg)    Physical Exam  Nursing note and vitals reviewed. Constitutional: She is oriented to person, place, and time. She appears well-developed. No distress.  HENT:  Head: Normocephalic and atraumatic.  Mouth/Throat: Oropharynx is clear and moist and mucous membranes are normal.  Eyes: Pupils are equal, round, and reactive to light. Conjunctivae are normal.  Cardiovascular: Normal rate and regular rhythm.  No murmur heard. Pulses:      Dorsalis pedis pulses are 2+ on the right side, and 2+ on the left side.  Respiratory: Effort normal and breath sounds normal. No respiratory distress.  GI: Soft. She exhibits no mass. There is no hepatomegaly. There is no tenderness.  Musculoskeletal: She exhibits no edema.  Lymphadenopathy:    She has no cervical adenopathy.  Neurological: She is alert and oriented to person, place, and time. She has normal strength. No cranial nerve deficit. Gait normal.  Skin: Skin is warm. No rash noted. No erythema.  Psychiatric: Her mood appears anxious.  Well groomed, good eye contact.    ASSESSMENT AND PLAN:   Kristen Wolf is here today for HTN follow up.  Orders Placed This Encounter  Procedures  . Potassium   Lab Results  Component Value Date   K 4.0 02/19/2018    Hypokalemia  Continue with nonpharmacologic treatment. Further recommendations will be given according to potassium results. We discussed side effects of HCTZ.   Essential hypertension, benign Otherwise adequately controlled. No changes in current management. Continue low-salt diet. Eye exam is current. Follow-up in 6 months, before if needed.  Class 1 obesity  with body mass index (BMI) of 33.0 to 33.9 in adult She gained about 2 pounds since her last visit, 02/05/2018. We discussed benefits of wt loss as well as adverse effects of obesity. Consistency with healthy diet and physical activity recommended.    Hyperlipidemia She is not interested in having lipid panel done today nor trying pharmacologic treatment. We discussed risk factors for CVD. She will continue nonpharmacologic treatment. We will plan on checking lipid panel next visit.       Johnaton Sonneborn G. Martin[place], MD  [facility]. [place] office.

## 2018-02-19 NOTE — Assessment & Plan Note (Signed)
She is not interested in having lipid panel done today nor trying pharmacologic treatment. We discussed risk factors for CVD. She will continue nonpharmacologic treatment. We will plan on checking lipid panel next visit.

## 2018-02-19 NOTE — Assessment & Plan Note (Signed)
Otherwise adequately controlled. No changes in current management. Continue low-salt diet. Eye exam is current. Follow-up in 6 months, before if needed.

## 2018-02-19 NOTE — Assessment & Plan Note (Signed)
She gained about 2 pounds since her last visit, 02/05/2018. We discussed benefits of wt loss as well as adverse effects of obesity. Consistency with healthy diet and physical activity recommended.

## 2018-02-19 NOTE — Patient Instructions (Addendum)
A few things to remember from today's visit:   Hypokalemia - Plan: Potassium  Essential hypertension, benign  Hyperlipidemia, unspecified hyperlipidemia type  No changes in medications today. Continue low-fat diet for her cholesterol. Continue regular exercise and a healthy diet. I will see you back in about 6 months.  Please be sure medication list is accurate. If a new problem present, please set up appointment sooner than planned today.

## 2018-03-11 IMAGING — NM NM MISC PROCEDURE
3 series · 18 of 18 positions shown · non-contrast
Comparison: none

[Series 1: wbr_s-proj_st stress_(id)_sa · 6.5mm · 6.51mm/px · 6 of 64 frames shown (1 of 2)]
[frame 6/64]
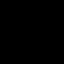
[frame 16/64]
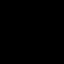
[frame 27/64]
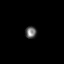
[frame 38/64]
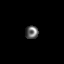
[frame 48/64]
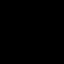
[frame 59/64]
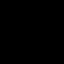

[Series 1: wbr_r-proj_st rest_(id)_sa · 6.5mm · 6.51mm/px · 6 of 64 frames shown]
[frame 6/64]
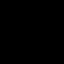
[frame 16/64]
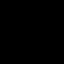
[frame 27/64]
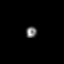
[frame 38/64]
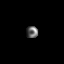
[frame 48/64]
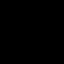
[frame 59/64]
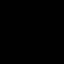

[Series 1: wbr_s-proj_st stress_(id)_sa · 6.5mm · 6.51mm/px · 6 of 512 frames shown (2 of 2)]
[frame 43/512]
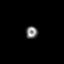
[frame 128/512]
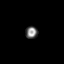
[frame 214/512]
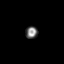
[frame 299/512]
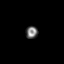
[frame 384/512]
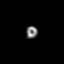
[frame 470/512]
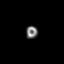

[18 of 18 positions shown; findings below may reference images not displayed]

Canned report from images found in remote index.

Refer to host system for actual result text.

## 2018-04-22 DIAGNOSIS — Z23 Encounter for immunization: Secondary | ICD-10-CM | POA: Diagnosis not present

## 2018-08-20 ENCOUNTER — Ambulatory Visit: Payer: Medicare Other | Admitting: Family Medicine

## 2018-10-24 ENCOUNTER — Ambulatory Visit (INDEPENDENT_AMBULATORY_CARE_PROVIDER_SITE_OTHER): Payer: Medicare Other | Admitting: Family Medicine

## 2018-10-24 ENCOUNTER — Other Ambulatory Visit: Payer: Self-pay

## 2018-10-24 VITALS — HR 80 | Resp 12

## 2018-10-24 DIAGNOSIS — S70362A Insect bite (nonvenomous), left thigh, initial encounter: Secondary | ICD-10-CM | POA: Diagnosis not present

## 2018-10-24 DIAGNOSIS — E6609 Other obesity due to excess calories: Secondary | ICD-10-CM

## 2018-10-24 DIAGNOSIS — Z6833 Body mass index (BMI) 33.0-33.9, adult: Secondary | ICD-10-CM

## 2018-10-24 DIAGNOSIS — W57XXXA Bitten or stung by nonvenomous insect and other nonvenomous arthropods, initial encounter: Secondary | ICD-10-CM

## 2018-10-24 DIAGNOSIS — J301 Allergic rhinitis due to pollen: Secondary | ICD-10-CM

## 2018-10-24 DIAGNOSIS — I10 Essential (primary) hypertension: Secondary | ICD-10-CM

## 2018-10-24 MED ORDER — AMLODIPINE BESYLATE-VALSARTAN 10-160 MG PO TABS: 1.0000 | ORAL_TABLET | Freq: Every day | ORAL | 2 refills | Status: DC

## 2018-10-24 MED ORDER — HYDROCHLOROTHIAZIDE 12.5 MG PO CAPS: 12.5000 mg | ORAL_CAPSULE | ORAL | 2 refills | Status: DC

## 2018-10-24 MED ORDER — FLUTICASONE PROPIONATE 50 MCG/ACT NA SUSP: 1.0000 | Freq: Every day | NASAL | 6 refills | Status: AC

## 2018-10-24 NOTE — Assessment & Plan Note (Signed)
We discussed benefits of wt loss as well as adverse effects of obesity. Consistency with healthy diet and physical activity recommended.  

## 2018-10-24 NOTE — Assessment & Plan Note (Signed)
Based on numbers BP seems to be adequately controlled. No changes in current management. Continue low-salt diet. Recommend checking BP periodically even if she does not feel it is elevated. Follow-up in 5 months.

## 2018-10-24 NOTE — Progress Notes (Signed)
Virtual Visit via Video Note   I connected with Kristen Hook on 10/25/18 at  8:00 AM EDT by a video enabled telemedicine application and verified that I am speaking with the correct person using two identifiers.  Location patient: home Location provider:work or home office Persons participating in the virtual visit: patient, provider  I discussed the limitations of evaluation and management by telemedicine and the availability of in person appointments.She expressed understanding and agreed to proceed.   HPI: Kristen Wolf is a 68 yo female following on some chronic medical problems. She was last seen on 02/19/18. No new problems since her last OV.  Hypertension: Currently she is on amlodipine-losartan 10-160 mg daily and HCTZ 12.5 mg daily. She checks BP occasionally, when she feels like BP may be elevated. She reported BP has "good", under 140/90.  She denies unusual headache, visual changes, chest pain, dyspnea, palpitation, abdominal pain, decreased urine output, gross hematuria, edema, or focal deficit.  She has noticed some weight gain, starting exercising a few days ago, walking 2 to 3 miles daily and following a healthier diet.  Allergy rhinitis, Flonase nasal spray daily as needed and Claritin. Nasal congestion or rhinorrhea aggravated by pollen. Negative for chills, fever, body aches, sore throat, cough, or wheezing.   She found a tick on her left thigh 2 to 3 days ago, it was not embedded and tick was not engorged. There is a erythematous area, no pruritus or tenderness. She has used tea tree oil.   ROS: See pertinent positives and negatives per HPI. COVID-19 screening questions: Negative for known exposure to COVID-19. Negative for loss in the sense of smell or taste.   Past Medical History:  Diagnosis Date  . Allergy   . Arthritis    hands  . Cancer (HCC)    hx rectal cancer  . 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 Cyst (solitary) of breast    L breast cyst,  mamogram in September "unchanged"  . Gout   . Hyperlipidemia   . Hypertension   . Kidney cysts   . Liver cyst     Past Surgical History:  Procedure Laterality Date  . ABDOMINAL HYSTERECTOMY     1993  . APPENDECTOMY     1980  . COLONOSCOPY  03/2011   Hx rectal cancer  . PARATHYROIDECTOMY     2005  . PARATHYROIDECTOMY     2006  . POLYPECTOMY    . TUBAL LIGATION     1980  . TUBAL LIGATION      Family History  Problem Relation Age of Onset  . Asthma Mother   . Hypertension Father   . Multiple myeloma Sister   . Colon cancer Neg Hx   . Esophageal cancer Neg Hx   . Stomach cancer Neg Hx     Social History   Socioeconomic History  . Marital status: Divorced    Spouse name: Not on file  . Number of children: Not on file  . Years of education: Not on file  . Highest education level: Not on file  Occupational History  . Not on file  Social Needs  . Financial resource strain: Not on file  . Food insecurity:    Worry: Not on file    Inability: Not on file  . Transportation needs:    Medical: Not on file    Non-medical: Not on file  Tobacco Use  . Smoking status: Never Smoker  . Smokeless tobacco: Never Used  Substance  and Sexual Activity  . Alcohol use: No  . Drug use: No  . Sexual activity: Not on file  Lifestyle  . Physical activity:    Days per week: Not on file    Minutes per session: Not on file  . Stress: Not on file  Relationships  . Social connections:    Talks on phone: Not on file    Gets together: Not on file    Attends religious service: Not on file    Active member of club or organization: Not on file    Attends meetings of clubs or organizations: Not on file    Relationship status: Not on file  . Intimate partner violence:    Fear of current or ex partner: Not on file    Emotionally abused: Not on file    Physically abused: Not on file    Forced sexual activity: Not on file  Other Topics Concern  . Not on file  Social History  Narrative  . Not on file      Current Outpatient Medications:  .  amLODipine-valsartan (EXFORGE) 10-160 MG tablet, Take 1 tablet by mouth daily., Disp: 90 tablet, Rfl: 2 .  Calcium-Magnesium (CAL-MAG PO), Take by mouth every morning. Powder, Disp: , Rfl:  .  Celery Seed OIL, by Does not apply route., Disp: , Rfl:  .  cholecalciferol (VITAMIN D) 1000 UNITS tablet, Take 1,000 Units by mouth daily. , Disp: , Rfl:  .  co-enzyme Q-10 30 MG capsule, Take 30 mg by mouth daily.  , Disp: , Rfl:  .  fluticasone (FLONASE) 50 MCG/ACT nasal spray, Place 1 spray into both nostrils daily., Disp: 16 g, Rfl: 6 .  hydrochlorothiazide (MICROZIDE) 12.5 MG capsule, Take 1 capsule (12.5 mg total) by mouth every morning., Disp: 90 capsule, Rfl: 2 .  IRON PO, Take by mouth 2 (two) times a week., Disp: , Rfl:  .  Magnesium 250 MG TABS, Take 250 mg by mouth daily., Disp: , Rfl:  .  POTASSIUM PO, Take by mouth daily., Disp: , Rfl:   EXAM:  VITALS per patient if applicable: Pulse 80   Resp 12   GENERAL: alert, oriented, appears well and in no acute distress  HEENT: atraumatic, conjunctiva clear, no obvious facial abnormalities on inspection.  NECK: normal movements of the head and neck  LUNGS: on inspection no signs of respiratory distress, breathing rate appears normal, no obvious gross SOB, gasping or wheezing  CV: no obvious cyanosis  Kristen: moves all visible extremities without noticeable abnormality  SKIN: macular rounder erythematous lesion mid and  posterior aspect of thigh about 1.5 cm.  PSYCH/NEURO: pleasant and cooperative, no obvious depression or anxiety, speech and thought processing grossly intact  ASSESSMENT AND PLAN:  Discussed the following assessment and plan:  Tick bite of left thigh, initial encounter No signs of infection. Continue monitoring for changes. Instructed about warning signs. F/U as needed.  Essential hypertension, benign Based on numbers BP seems to be adequately  controlled. No changes in current management. Continue low-salt diet. Recommend checking BP periodically even if she does not feel it is elevated. Follow-up in 5 months.  Allergic rhinitis Otherwise problem is well controlled. No changes in Flonase nasal spray or Claritin. Recommend saline nasal irrigations as needed.  Class 1 obesity with body mass index (BMI) of 33.0 to 33.9 in adult We discussed benefits of wt loss as well as adverse effects of obesity. Consistency with healthy diet and physical activity recommended. Marland Kitchen  I discussed the assessment and treatment plan with the patient. She was provided an opportunity to ask questions and all were answered. The patient agreed with the plan and demonstrated an understanding of the instructions.   The patient was advised to call back or seek an in-person evaluation if the symptoms worsen or if the condition fails to improve as anticipated.  Return in about 4 months (around 03/04/2019).    Zenaya Ulatowski Martin[place], MD

## 2018-10-24 NOTE — Assessment & Plan Note (Signed)
Otherwise problem is well controlled. No changes in Flonase nasal spray or Claritin. Recommend saline nasal irrigations as needed.

## 2018-10-25 ENCOUNTER — Encounter: Payer: Self-pay | Admitting: Family Medicine

## 2019-04-14 DIAGNOSIS — Z23 Encounter for immunization: Secondary | ICD-10-CM | POA: Diagnosis not present

## 2019-05-13 ENCOUNTER — Other Ambulatory Visit: Payer: Self-pay

## 2019-05-26 ENCOUNTER — Telehealth (INDEPENDENT_AMBULATORY_CARE_PROVIDER_SITE_OTHER): Payer: Medicare Other | Admitting: Family Medicine

## 2019-05-26 ENCOUNTER — Encounter: Payer: Self-pay | Admitting: Family Medicine

## 2019-05-26 VITALS — Ht 61.0 in

## 2019-05-26 DIAGNOSIS — R05 Cough: Secondary | ICD-10-CM | POA: Diagnosis not present

## 2019-05-26 DIAGNOSIS — J301 Allergic rhinitis due to pollen: Secondary | ICD-10-CM | POA: Diagnosis not present

## 2019-05-26 DIAGNOSIS — H35033 Hypertensive retinopathy, bilateral: Secondary | ICD-10-CM | POA: Insufficient documentation

## 2019-05-26 DIAGNOSIS — R059 Cough, unspecified: Secondary | ICD-10-CM

## 2019-05-26 DIAGNOSIS — J069 Acute upper respiratory infection, unspecified: Secondary | ICD-10-CM | POA: Diagnosis not present

## 2019-05-26 MED ORDER — BENZONATATE 100 MG PO CAPS: 200.0000 mg | ORAL_CAPSULE | Freq: Two times a day (BID) | ORAL | 0 refills | Status: AC | PRN

## 2019-05-26 NOTE — Progress Notes (Signed)
Virtual Visit via Telephone Note  I connected with Kristen Wolf on 05/26/19 at  3:30 PM EST by telephone and verified that I am speaking with the correct person using two identifiers.We were planning on virtual video visit but not able to do so.   I discussed the limitations, risks, security and privacy concerns of performing an evaluation and management service by telephone and the availability of in person appointments. I also discussed with the patient that there may be a patient responsible charge related to this service. The patient expressed understanding and agreed to proceed.  Location patient: home Location provider: work office Participants present for the call: patient, provider Patient did not have a visit in the prior 7 days to address this/these issue(s).   History of Present Illness: Kristen Wolf is a 68 yo female with Hx of HTN , allergic rhinitis,and anxiety c/o a week of cough. Initially cough was nonproductive but today she has had some clear sputum, she denies hemoptysis. "Low-grade fever" when symptoms first started. She denies chills, body aches, sore throat, dyspnea, wheezing, abdominal pain, nausea, vomiting, changes in bowel habits, or skin rash. Negative for changes in the smell or taste.  + Rhinorrhea. She is not using Flonase nasal spray, she was not sure if she should continue while having respiratory symptoms. She denies sick contact.  Mucinex today. Feeling better.   Observations/Objective: Patient sounds cheerful and well on the phone. I do not appreciate any SOB.No stridor or cough appreciated. Clearing throat a few times during visit. Speech and thought processing are grossly intact. Patient reported vitals:Ht 5\' 1"  (1.549 m)   BMI 37.10 kg/m    Assessment and Plan:  1. URI, acute Symptoms have improved. Explained that process is most likely viral,in which case abx is not recommended. Instructed to monitor for fever or new respiratory  symptom.  2. Allergic rhinitis due to pollen, unspecified seasonality This problem could be contributing to cough. Flonase nasal spray daily and saline nasal irrigations recommended. OTC antihistaminic may also help, Cetrizine 10 mg daily or allegra 180 mg daily.   3. Cough Explained that cough can last a few weeks after an URI. Symptomatic treatment recommended. Plain Mucinex may also help. Adequate hydration. Hx does not suggest pneumonia, so I do not think she needs to go to the ED for CXR at this time. Instructed about warning signs.  - benzonatate (TESSALON) 100 MG capsule; Take 2 capsules (200 mg total) by mouth 2 (two) times daily as needed for up to 10 days.  Dispense: 40 capsule; Refill: 0   Follow Up Instructions:  Return if symptoms worsen or fail to improve.  I did not refer this patient for an OV in the next 24 hours for this/these issue(s).  I discussed the assessment and treatment plan with the patient. She was provided an opportunity to ask questions and all were answered. She agreed with the plan and demonstrated an understanding of the instructions.    I provided 15 minutes of non-face-to-face time during this encounter.   Winton Offord Martin[place], MD

## 2019-06-03 ENCOUNTER — Encounter: Payer: Self-pay | Admitting: Family Medicine

## 2019-08-09 ENCOUNTER — Ambulatory Visit: Payer: Medicare Other | Attending: Internal Medicine

## 2019-08-09 DIAGNOSIS — Z23 Encounter for immunization: Secondary | ICD-10-CM

## 2019-08-09 NOTE — Progress Notes (Signed)
   Covid-19 Vaccination Clinic  Name:  Kristen Wolf    MRN: [id number] DOB: Feb 13, 1951  08/09/2019  [name] was observed post Covid-19 immunization for 15 minutes without incidence. She was provided with Vaccine Information Sheet and instruction to access the V-Safe system.   [name] was instructed to call 911 with any severe reactions post vaccine: Marland Kitchen Difficulty breathing  . Swelling of your face and throat  . A fast heartbeat  . A bad rash all over your body  . Dizziness and weakness    Immunizations Administered    Name Date Dose VIS Date Route   Pfizer COVID-19 Vaccine 08/09/2019  9:45 AM 0.3 mL 05/29/2019 Intramuscular   Manufacturer: [place]   Lot: [id number]   [place]: [id number]

## 2019-09-02 ENCOUNTER — Ambulatory Visit: Payer: Medicare Other | Attending: Internal Medicine

## 2019-09-02 DIAGNOSIS — Z23 Encounter for immunization: Secondary | ICD-10-CM

## 2019-09-02 NOTE — Progress Notes (Signed)
   Covid-19 Vaccination Clinic  Name:  Kristen Wolf    MRN: [id number] DOB: 05/31/1951  09/02/2019  [name] was observed post Covid-19 immunization for 15 minutes without incident. She was provided with Vaccine Information Sheet and instruction to access the V-Safe system.   [name] was instructed to call 911 with any severe reactions post vaccine: Marland Kitchen Difficulty breathing  . Swelling of face and throat  . A fast heartbeat  . A bad rash all over body  . Dizziness and weakness   Immunizations Administered    Name Date Dose VIS Date Route   Pfizer COVID-19 Vaccine 09/02/2019  9:16 AM 0.3 mL 05/29/2019 Intramuscular   Manufacturer: [place]   Lot: [id number]   [place]: [id number]

## 2019-09-22 ENCOUNTER — Other Ambulatory Visit: Payer: Self-pay | Admitting: Family Medicine

## 2019-09-22 DIAGNOSIS — I10 Essential (primary) hypertension: Secondary | ICD-10-CM

## 2019-10-05 DIAGNOSIS — I10 Essential (primary) hypertension: Secondary | ICD-10-CM | POA: Diagnosis not present

## 2019-10-05 DIAGNOSIS — H2513 Age-related nuclear cataract, bilateral: Secondary | ICD-10-CM | POA: Diagnosis not present

## 2019-10-05 DIAGNOSIS — H35033 Hypertensive retinopathy, bilateral: Secondary | ICD-10-CM | POA: Diagnosis not present

## 2019-10-05 DIAGNOSIS — H25813 Combined forms of age-related cataract, bilateral: Secondary | ICD-10-CM | POA: Diagnosis not present

## 2019-10-18 ENCOUNTER — Other Ambulatory Visit: Payer: Self-pay | Admitting: Family Medicine

## 2019-10-18 DIAGNOSIS — I10 Essential (primary) hypertension: Secondary | ICD-10-CM

## 2019-10-23 ENCOUNTER — Other Ambulatory Visit: Payer: Self-pay | Admitting: Family Medicine

## 2019-10-23 DIAGNOSIS — I10 Essential (primary) hypertension: Secondary | ICD-10-CM

## 2019-11-23 ENCOUNTER — Other Ambulatory Visit: Payer: Self-pay

## 2019-11-24 ENCOUNTER — Telehealth: Payer: Self-pay | Admitting: Family Medicine

## 2019-11-24 ENCOUNTER — Other Ambulatory Visit: Payer: Self-pay | Admitting: *Deleted

## 2019-11-24 ENCOUNTER — Encounter: Payer: Self-pay | Admitting: Family Medicine

## 2019-11-24 ENCOUNTER — Ambulatory Visit (INDEPENDENT_AMBULATORY_CARE_PROVIDER_SITE_OTHER): Payer: Medicare Other | Admitting: Family Medicine

## 2019-11-24 VITALS — BP 120/72 | HR 88 | Temp 97.5°F | Resp 16 | Ht 61.0 in | Wt 205.4 lb

## 2019-11-24 DIAGNOSIS — E876 Hypokalemia: Secondary | ICD-10-CM | POA: Diagnosis not present

## 2019-11-24 DIAGNOSIS — I10 Essential (primary) hypertension: Secondary | ICD-10-CM | POA: Diagnosis not present

## 2019-11-24 DIAGNOSIS — Z0000 Encounter for general adult medical examination without abnormal findings: Secondary | ICD-10-CM | POA: Diagnosis not present

## 2019-11-24 DIAGNOSIS — E785 Hyperlipidemia, unspecified: Secondary | ICD-10-CM

## 2019-11-24 DIAGNOSIS — E559 Vitamin D deficiency, unspecified: Secondary | ICD-10-CM

## 2019-11-24 DIAGNOSIS — Z6837 Body mass index (BMI) 37.0-37.9, adult: Secondary | ICD-10-CM

## 2019-11-24 MED ORDER — AMLODIPINE BESYLATE-VALSARTAN 10-160 MG PO TABS: 1.0000 | ORAL_TABLET | Freq: Every day | ORAL | 1 refills | Status: DC

## 2019-11-24 MED ORDER — HYDROCHLOROTHIAZIDE 12.5 MG PO CAPS: ORAL_CAPSULE | ORAL | 2 refills | Status: DC

## 2019-11-24 NOTE — Telephone Encounter (Signed)
Rx sent to pharmacy as requested.

## 2019-11-24 NOTE — Telephone Encounter (Signed)
Pt forgot to mention during her office visit today that she needs a refill on Hydrocholorthiazide 12.5 mg capsule. Pt uses [facility] on [address]. Thanks

## 2019-11-24 NOTE — Progress Notes (Signed)
Chief Complaint  Patient presents with  . Annual Wellness Visit  . Follow-up   HPI: [name] is a 69 y.o. female, who is here today for 6 months follow up and AWV.   She was last seen on 03/26/2019. No new problems since her last OV.  She lives alone. Independent ADL's and IADL's. No falls in the past year and denies depression symptoms.  Functional Status Survey: Is the patient deaf or have difficulty hearing?: No Does the patient have difficulty seeing, even when wearing glasses/contacts?: No Does the patient have difficulty concentrating, remembering, or making decisions?: No Does the patient have difficulty walking or climbing stairs?: No Does the patient have difficulty dressing or bathing?: No Does the patient have difficulty doing errands alone such as visiting a doctor's office or shopping?: No  Fall Risk  11/24/2019 05/13/2019 02/05/2018 01/15/2018  Falls in the past year? 0 0 No No  Comment - Emmi Telephone Survey: data to providers prior to load one the year before last  Emmi Telephone Survey: data to providers prior to load  Number falls in past yr: 0 - - -  Injury with Fall? 0 - - -  Follow up Education provided - - -   Providers she sees regularly: Eye care provider: Dr Sabra Heck.  Depression screen [facility] 2/9 11/24/2019  Decreased Interest 0  Down, Depressed, Hopeless 0  PHQ - 2 Score 0    Mini-Cog - 11/24/19 1126    Normal clock drawing test? yes    How many words correct? 3           Hearing Screening   '125Hz'  '250Hz'  '500Hz'  '1000Hz'  '2000Hz'  '3000Hz'  '4000Hz'  '6000Hz'  '8000Hz'   Right ear:           Left ear:           Vision Screening Comments: Refused. Saw her eye care provider last month.  HTN: Dx'ed 33+ years ago. Negative for severe/frequent headache, visual changes, chest pain, dyspnea, palpitation, focal weakness, or edema. She is on Amlodipine-Valsartan 10-160 mg daily and HCTZ 12.5 mg daily.  HypoK+: On OTC K+ supplementation.  Lab Results    Component Value Date   CREATININE 1.10 02/05/2018   BUN 16 02/05/2018   NA 138 02/05/2018   K 4.0 02/19/2018   CL 101 02/05/2018   CO2 28 02/05/2018    HLD: She is on non pharmacologic treatment. She has declined statin. 11/05/16: TC 289,TG 193,HDL 47,and LDL 203. Vit D deficiency: She is on Vit D 1000 U daily.  She is walking daily for 30-60 mg. Trying to eat more vegetables. Snacks on potatoes chips,cookies,and occasional ice cream.  Review of Systems  Constitutional: Negative for activity change, appetite change, fatigue and fever.  HENT: Negative for mouth sores, nosebleeds and sore throat.   Respiratory: Negative for cough and wheezing.   Gastrointestinal: Negative for abdominal pain, nausea and vomiting.       Negative for changes in bowel habits.  Genitourinary: Negative for decreased urine volume, difficulty urinating, dysuria and hematuria.  Musculoskeletal: Negative for gait problem and myalgias.  Skin: Negative for rash and wound.  Neurological: Negative for syncope, facial asymmetry and weakness.  Psychiatric/Behavioral: Negative for confusion. The patient is not nervous/anxious.   Rest of ROS, see pertinent positives sand negatives in HPI  Current Outpatient Medications on File Prior to Visit  Medication Sig Dispense Refill  . Calcium-Magnesium (CAL-MAG PO) Take by mouth every morning. Powder    . cholecalciferol (VITAMIN  D) 1000 UNITS tablet Take 1,000 Units by mouth daily.     Marland Kitchen co-enzyme Q-10 30 MG capsule Take 30 mg by mouth daily.      . fluticasone (FLONASE) 50 MCG/ACT nasal spray Place 1 spray into both nostrils daily. 16 g 6  . IRON PO Take by mouth 2 (two) times a week.    . Magnesium 250 MG TABS Take 250 mg by mouth daily.    Marland Kitchen POTASSIUM PO Take by mouth daily.     No current facility-administered medications on file prior to visit.   Past Medical History:  Diagnosis Date  . Allergy   . Arthritis    hands  . Cancer (HCC)    hx rectal cancer  .  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 Cyst (solitary) of breast    L breast cyst, mamogram in September "unchanged"  . Gout   . Hyperlipidemia   . Hypertension   . Kidney cysts   . Liver cyst    No Active Allergies  Social History   Socioeconomic History  . Marital status: Divorced    Spouse name: Not on file  . Number of children: Not on file  . Years of education: Not on file  . Highest education level: Not on file  Occupational History  . Not on file  Tobacco Use  . Smoking status: Never Smoker  . Smokeless tobacco: Never Used  Vaping Use  . Vaping Use: Never used  Substance and Sexual Activity  . Alcohol use: No  . Drug use: No  . Sexual activity: Not on file  Other Topics Concern  . Not on file  Social History Narrative  . Not on file   Social Determinants of Health   Financial Resource Strain:   . Difficulty of Paying Living Expenses:   Food Insecurity:   . Worried About Charity fundraiser in the Last Year:   . Arboriculturist in the Last Year:   Transportation Needs:   . Film/video editor (Medical):   Marland Kitchen Lack of Transportation (Non-Medical):   Physical Activity:   . Days of Exercise per Week:   . Minutes of Exercise per Session:   Stress:   . Feeling of Stress :   Social Connections:   . Frequency of Communication with Friends and Family:   . Frequency of Social Gatherings with Friends and Family:   . Attends Religious Services:   . Active Member of Clubs or Organizations:   . Attends Archivist Meetings:   Marland Kitchen Marital Status:     Vitals:   11/24/19 1039  BP: 120/72  Pulse: 88  Resp: 16  Temp: (!) 97.5 F (36.4 C)  SpO2: 96%   Wt Readings from Last 3 Encounters:  11/24/19 205 lb 6 oz (93.2 kg)  02/19/18 196 lb 6 oz (89.1 kg)  02/05/18 194 lb 4 oz (88.1 kg)    Body mass index is 38.81 kg/m.  Physical Exam Vitals and nursing note reviewed.  Constitutional:      General: She is not in acute distress.    Appearance: Normal  appearance. She is well-developed.  HENT:     Head: Normocephalic and atraumatic.     Mouth/Throat:     Mouth: Oropharynx is clear and moist and mucous membranes are normal.      Comments: Post nasal drainage.Eyes:     Conjunctiva/sclera: Conjunctivae normal.     Pupils: Pupils are equal, round, and reactive  to light.  Cardiovascular:     Rate and Rhythm: Normal rate and regular rhythm.     Pulses:          Dorsalis pedis pulses are 2+ on the right side and 2+ on the left side.     Heart sounds: No murmur heard.   Pulmonary:     Effort: Pulmonary effort is normal. No respiratory distress.     Breath sounds: Normal breath sounds.  Abdominal:     Palpations: Abdomen is soft. There is no hepatomegaly or mass.     Tenderness: There is no abdominal tenderness.  Musculoskeletal:        General: No edema.  Lymphadenopathy:     Cervical: No cervical adenopathy.  Skin:    General: Skin is warm.     Findings: No erythema or rash.  Neurological:     Mental Status: She is alert and oriented to person, place, and time.     Cranial Nerves: No cranial nerve deficit.     Gait: Gait normal.     Deep Tendon Reflexes: Strength normal.     Reflex Scores:      Patellar reflexes are 2+ on the right side and 2+ on the left side. Psychiatric:        Mood and Affect: Mood and affect normal.     Comments: Well groomed, good eye contact.    ASSESSMENT AND PLAN:  Ms. Berlyn Saylor was seen today for AWV and chronic disease management.  Orders Placed This Encounter  Procedures  . Basic metabolic panel  . VITAMIN D 25 Hydroxy (Vit-D Deficiency, Fractures)   She will be back for fasting labs in 10-14 days. We do not have lab availability today.  1. Medicare annual wellness visit, subsequent We discussed the importance of staying active, physically and mentally, as well as the benefits of a healthy/balance diet. She will schedule mammogram. Low impact exercise that involve stretching and  strengthing are ideal. Vaccines: Refused vaccination (Shigrix and Tdap).  We discussed preventive screening for the next 5-10 years, summery of recommendations given in AVS. Fall prevention.  Advance directives and end of life discussed, she doe snot have POA or living will. Advance health directive package given.  2. Essential hypertension, benign BP adequately controlled. No changes in current management. Monitor BP at home. Continue low salt diet. Eye exam up to date.  - amLODipine-valsartan (EXFORGE) 10-160 MG tablet; Take 1 tablet by mouth daily.  Dispense: 90 tablet; Refill: 1  3. Hypokalemia Continue potassium supplementation. Further recommendations according to lab results.  4. Vitamin D deficiency Continue Vit D 1000 U daily. 25 OH vit D to be done in 2 weeks.  5. Hyperlipidemia, unspecified hyperlipidemia type Continue low fat diet. Further recommendations according to lab results.  6. Class 2 severe obesity with serious comorbidity and body mass index (BMI) of 37.0 to 37.9 in adult, unspecified obesity type [facility]) She has gained about 6 Lb since her last visit. We discussed benefits of wt loss as well as adverse effects of obesity. Consistency with healthy diet and physical activity recommended.  Return in about 6 months (around 05/25/2020) for labs 12/02/19 or when service available here in office..   Dilan Fullenwider G. Martinique, MD  [facility] [place] office.   Ms. Briel , Thank you for taking time to come for your Medicare Wellness Visit. I appreciate your ongoing commitment to your health goals. Please review the following plan we discussed and let me know  if I can assist you in the future.   These are the goals we discussed: Goals    . DIET - EAT MORE FRUITS AND VEGETABLES     Cut out more snacks Sometimes substitute bean salads        This is a list of the screening recommended for you and due dates:  Health Maintenance  Topic Date Due  .   Hepatitis C: One time screening is recommended by Center for Disease Control  (CDC) for  adults born from 18 through 1966.   Never done  . Tetanus Vaccine  Never done  . Pneumonia vaccines (2 of 2 - PCV13) 05/01/2018  . Flu Shot  01/17/2020  . Mammogram  01/21/2020  . Colon Cancer Screening  05/15/2021  . DEXA scan (bone density measurement)  Completed  . COVID-19 Vaccine  Completed   A few tips:  -As we age balance is not as good as it was, so there is a higher risks for falls. Please remove small rugs and furniture that is "in your way" and could increase the risk of falls. Stretching exercises may help with fall prevention: Yoga and Tai Chi are some examples. Low impact exercise is better, so you are not very achy the next day.  -Sun screen and avoidance of direct sun light recommended. Caution with dehydration, if working outdoors be sure to drink enough fluids.  - Some medications are not safe as we age, increases the risk of side effects and can potentially interact with other medication you are also taken;  including some of over the counter medications. Be sure to let me know when you start a new medication even if it is a dietary/vitamin supplement.   -Healthy diet low in red meet/animal fat and sugar + regular physical activity is recommended.

## 2019-11-24 NOTE — Patient Instructions (Addendum)
  Ms. Pondexter , Thank you for taking time to come for your Medicare Wellness Visit. I appreciate your ongoing commitment to your health goals. Please review the following plan we discussed and let me know if I can assist you in the future.   These are the goals we discussed: Goals    . DIET - EAT MORE FRUITS AND VEGETABLES     Cut out more snacks Sometimes substitute bean salads        This is a list of the screening recommended for you and due dates:  Health Maintenance  Topic Date Due  .  Hepatitis C: One time screening is recommended by Center for Disease Control  (CDC) for  adults born from [birth year] through 1966.   Never done  . Tetanus Vaccine  Never done  . Pneumonia vaccines (2 of 2 - PCV13) 05/01/2018  . Flu Shot  01/17/2020  . Mammogram  01/21/2020  . Colon Cancer Screening  05/15/2021  . DEXA scan (bone density measurement)  Completed  . COVID-19 Vaccine  Completed   A few tips:  -As we age balance is not as good as it was, so there is a higher risks for falls. Please remove small rugs and furniture that is "in your way" and could increase the risk of falls. Stretching exercises may help with fall prevention: Yoga and Tai Chi are some examples. Low impact exercise is better, so you are not very achy the next day.  -Sun screen and avoidance of direct sun light recommended. Caution with dehydration, if working outdoors be sure to drink enough fluids.  - Some medications are not safe as we age, increases the risk of side effects and can potentially interact with other medication you are also taken;  including some of over the counter medications. Be sure to let me know when you start a new medication even if it is a dietary/vitamin supplement.   -Healthy diet low in red meet/animal fat and sugar + regular physical activity is recommended.

## 2019-12-07 ENCOUNTER — Other Ambulatory Visit: Payer: Self-pay

## 2019-12-08 ENCOUNTER — Other Ambulatory Visit (INDEPENDENT_AMBULATORY_CARE_PROVIDER_SITE_OTHER): Payer: Medicare Other

## 2019-12-08 DIAGNOSIS — I10 Essential (primary) hypertension: Secondary | ICD-10-CM | POA: Diagnosis not present

## 2019-12-08 DIAGNOSIS — E559 Vitamin D deficiency, unspecified: Secondary | ICD-10-CM

## 2019-12-08 DIAGNOSIS — E876 Hypokalemia: Secondary | ICD-10-CM | POA: Diagnosis not present

## 2019-12-08 LAB — BASIC METABOLIC PANEL
BUN: 18 mg/dL (ref 6–23)
CO2: 28 mEq/L (ref 19–32)
Calcium: 9.6 mg/dL (ref 8.4–10.5)
Chloride: 100 mEq/L (ref 96–112)
Creatinine, Ser: 1.21 mg/dL — ABNORMAL HIGH (ref 0.40–1.20)
GFR: 53.16 mL/min — ABNORMAL LOW (ref >60.00)
Glucose, Bld: 90 mg/dL (ref 70–99)
Potassium: 3.2 mEq/L — ABNORMAL LOW (ref 3.5–5.1)
Sodium: 136 mEq/L (ref 135–145)

## 2019-12-08 LAB — VITAMIN D 25 HYDROXY (VIT D DEFICIENCY, FRACTURES): VITD: 96.92 ng/mL (ref 30.00–100.00)

## 2019-12-21 NOTE — Addendum Note (Signed)
Addended by: Martinique, Javis Abboud G on: 12/21/2019 08:32 PM   Modules accepted: Orders

## 2019-12-22 ENCOUNTER — Other Ambulatory Visit: Payer: Self-pay

## 2019-12-22 MED ORDER — POTASSIUM CHLORIDE CRYS ER 20 MEQ PO TBCR
20.0000 meq | EXTENDED_RELEASE_TABLET | Freq: Every day | ORAL | 0 refills | Status: DC
Start: 2019-12-22 — End: 2020-04-13

## 2020-01-01 ENCOUNTER — Other Ambulatory Visit: Payer: Self-pay

## 2020-01-01 DIAGNOSIS — E876 Hypokalemia: Secondary | ICD-10-CM

## 2020-01-12 ENCOUNTER — Other Ambulatory Visit: Payer: Medicare Other

## 2020-01-12 ENCOUNTER — Encounter: Payer: Self-pay | Admitting: Family Medicine

## 2020-01-12 ENCOUNTER — Other Ambulatory Visit: Payer: Self-pay

## 2020-01-12 DIAGNOSIS — E876 Hypokalemia: Secondary | ICD-10-CM

## 2020-01-12 LAB — BASIC METABOLIC PANEL
BUN/Creatinine Ratio: 15 (calc) (ref 6–22)
BUN: 17 mg/dL (ref 7–25)
CO2: 28 mmol/L (ref 20–32)
Calcium: 9.4 mg/dL (ref 8.6–10.4)
Chloride: 101 mmol/L (ref 98–110)
Creat: 1.15 mg/dL — ABNORMAL HIGH (ref 0.60–0.93)
Glucose, Bld: 101 mg/dL — ABNORMAL HIGH (ref 65–99)
Potassium: 3.6 mmol/L (ref 3.5–5.3)
Sodium: 139 mmol/L (ref 135–146)

## 2020-02-29 DIAGNOSIS — Z23 Encounter for immunization: Secondary | ICD-10-CM | POA: Diagnosis not present

## 2020-03-24 ENCOUNTER — Telehealth: Payer: Self-pay | Admitting: Family Medicine

## 2020-03-24 NOTE — Progress Notes (Signed)
  Chronic Care Management   Note  03/24/2020 Name: Kristen Wolf MRN: [id number] DOB: Oct 24, 1950  Kristen Wolf is a 69 y.o. year old female who is a primary care patient of Martinique, Malka So, MD. I reached out to [name] by phone today in response to a referral sent by Ms. Lamar Laundry Trego's PCP, Martinique, Betty G, MD.   Ms. Gorgas was given information about Chronic Care Management services today including:  1. CCM service includes personalized support from designated clinical staff supervised by her physician, including individualized plan of care and coordination with other care providers 2. 24/7 contact phone numbers for assistance for urgent and routine care needs. 3. Service will only be billed when office clinical staff spend 20 minutes or more in a month to coordinate care. 4. Only one practitioner may furnish and bill the service in a calendar month. 5. The patient may stop CCM services at any time (effective at the end of the month) by phone call to the office staff.   Patient agreed to services and verbal consent obtained.   Follow up plan:   Carley Perdue [employer] Scheduler

## 2020-04-07 DIAGNOSIS — Z23 Encounter for immunization: Secondary | ICD-10-CM | POA: Diagnosis not present

## 2020-04-13 ENCOUNTER — Ambulatory Visit (INDEPENDENT_AMBULATORY_CARE_PROVIDER_SITE_OTHER): Payer: Medicare Other | Admitting: Family Medicine

## 2020-04-13 ENCOUNTER — Encounter: Payer: Self-pay | Admitting: Family Medicine

## 2020-04-13 ENCOUNTER — Other Ambulatory Visit: Payer: Self-pay

## 2020-04-13 ENCOUNTER — Telehealth: Payer: Self-pay | Admitting: *Deleted

## 2020-04-13 VITALS — BP 122/80 | HR 100 | Resp 16 | Ht 61.0 in | Wt 194.0 lb

## 2020-04-13 DIAGNOSIS — E785 Hyperlipidemia, unspecified: Secondary | ICD-10-CM | POA: Diagnosis not present

## 2020-04-13 DIAGNOSIS — Z6836 Body mass index (BMI) 36.0-36.9, adult: Secondary | ICD-10-CM | POA: Diagnosis not present

## 2020-04-13 DIAGNOSIS — I10 Essential (primary) hypertension: Secondary | ICD-10-CM | POA: Diagnosis not present

## 2020-04-13 DIAGNOSIS — H35033 Hypertensive retinopathy, bilateral: Secondary | ICD-10-CM

## 2020-04-13 MED ORDER — AMLODIPINE BESYLATE-VALSARTAN 10-160 MG PO TABS: 1.0000 | ORAL_TABLET | Freq: Every day | ORAL | 1 refills | Status: DC

## 2020-04-13 MED ORDER — HYDROCHLOROTHIAZIDE 12.5 MG PO CAPS: ORAL_CAPSULE | ORAL | 2 refills | Status: DC

## 2020-04-13 NOTE — Telephone Encounter (Signed)
Patient called stating she sen Dr Martinique this morning and she has things in her mychart that wasn't discussed during the visit. Patient states there is also an appointment in [place] that she is unaware about.

## 2020-04-13 NOTE — Assessment & Plan Note (Signed)
She has lost about 11 pounds since her last visit. Encouraged to continue following a healthful diet and engaging in regular physical activity. We discussed benefits of regular physical activity.

## 2020-04-13 NOTE — Progress Notes (Signed)
HPI: Kristen Wolf is a pleasant 69 y.o. female, who is here today for follow up.   She was last seen on 11/24/2019 for AWV. Since her last visit she has made some dietary changes.  Low-carb diet, increase vegetable intake, and walking almost daily. She has noted weight loss.  She does not check BP at home, it causes anxiety. Currently she is on amlodipine-valsartan 10-160 mg daily and HCTZ 12.5 mg daily. Hypokalemia, she is on OTC potassium supplementation. She has not noted unusual/frequent headache, visual changes, CP, dyspnea, palpitation, or focal weakness. Mild lower extremity edema at the end of the day, exacerbated by prolonged standing/walking.  Lab Results  Component Value Date   CREATININE 1.15 (H) 01/12/2020   BUN 17 01/12/2020   NA 139 01/12/2020   K 3.6 01/12/2020   CL 101 01/12/2020   CO2 28 01/12/2020   Hyperlipidemia: She is not fasting today. Currently she is on nonpharmacologic treatment. She has tried different statins and caused gout.  Last lipid panel in 10/2016. HDL 47, LDL 203, TC 239, and TG 193.  Review of Systems  Constitutional: Negative for activity change, appetite change and fatigue.  HENT: Negative for mouth sores, nosebleeds and sore throat.   Respiratory: Negative for cough and wheezing.   Gastrointestinal: Negative for abdominal pain, nausea and vomiting.       Negative for changes in bowel habits.  Genitourinary: Negative for decreased urine volume and hematuria.  Neurological: Negative for syncope, facial asymmetry and weakness.  Rest of ROS, see pertinent positives sand negatives in HPI  Current Outpatient Medications on File Prior to Visit  Medication Sig Dispense Refill  . Calcium-Magnesium (CAL-MAG PO) Take by mouth every morning. Powder    . cholecalciferol (VITAMIN D) 1000 UNITS tablet Take 1,000 Units by mouth daily.     Marland Kitchen co-enzyme Q-10 30 MG capsule Take 30 mg by mouth daily.      . fluticasone (FLONASE) 50 MCG/ACT  nasal spray Place 1 spray into both nostrils daily. 16 g 6  . IRON PO Take by mouth 2 (two) times a week.    . Magnesium 250 MG TABS Take 250 mg by mouth daily.     No current facility-administered medications on file prior to visit.   Past Medical History:  Diagnosis Date  . Allergy   . Arthritis    hands  . Cancer (HCC)    hx rectal cancer  . 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 Cyst (solitary) of breast    L breast cyst, mamogram in September "unchanged"  . Gout   . Hyperlipidemia   . Hypertension   . Kidney cysts   . Liver cyst    No Active Allergies  Social History   Socioeconomic History  . Marital status: Divorced    Spouse name: Not on file  . Number of children: Not on file  . Years of education: Not on file  . Highest education level: Not on file  Occupational History  . Not on file  Tobacco Use  . Smoking status: Never Smoker  . Smokeless tobacco: Never Used  Vaping Use  . Vaping Use: Never used  Substance and Sexual Activity  . Alcohol use: No  . Drug use: No  . Sexual activity: Not on file  Other Topics Concern  . Not on file  Social History Narrative  . Not on file   Social Determinants of Health   Financial Resource Strain:   .  Difficulty of Paying Living Expenses: Not on file  Food Insecurity:   . Worried About Charity fundraiser in the Last Year: Not on file  . Ran Out of Food in the Last Year: Not on file  Transportation Needs:   . Lack of Transportation (Medical): Not on file  . Lack of Transportation (Non-Medical): Not on file  Physical Activity:   . Days of Exercise per Week: Not on file  . Minutes of Exercise per Session: Not on file  Stress:   . Feeling of Stress : Not on file  Social Connections:   . Frequency of Communication with Friends and Family: Not on file  . Frequency of Social Gatherings with Friends and Family: Not on file  . Attends Religious Services: Not on file  . Active Member of Clubs or Organizations: Not on  file  . Attends Archivist Meetings: Not on file  . Marital Status: Not on file    Vitals:   04/13/20 1054 04/13/20 1139  BP: 122/80   Pulse: (!) 103 100  Resp: 16   SpO2: 97%    Body mass index is 36.66 kg/m.  Physical Exam Vitals and nursing note reviewed.  Constitutional:      General: She is not in acute distress.    Appearance: She is well-developed.  HENT:     Head: Normocephalic and atraumatic.     Mouth/Throat:     Mouth: Mucous membranes are moist.     Pharynx: Oropharynx is clear.  Eyes:     Conjunctiva/sclera: Conjunctivae normal.  Cardiovascular:     Rate and Rhythm: Normal rate and regular rhythm.     Pulses:          Dorsalis pedis pulses are 2+ on the right side and 2+ on the left side.     Heart sounds: No murmur heard.   Pulmonary:     Effort: Pulmonary effort is normal. No respiratory distress.     Breath sounds: Normal breath sounds.  Abdominal:     Palpations: Abdomen is soft. There is no hepatomegaly or mass.     Tenderness: There is no abdominal tenderness.  Lymphadenopathy:     Cervical: No cervical adenopathy.  Skin:    General: Skin is warm.     Findings: No erythema or rash.  Neurological:     Mental Status: She is alert and oriented to person, place, and time.     Cranial Nerves: No cranial nerve deficit.     Gait: Gait normal.  Psychiatric:     Comments: Well groomed, good eye contact.   ASSESSMENT AND PLAN:  [name] was seen today for follow-up.  No orders of the defined types were placed in this encounter.   Class 1 obesity with body mass index (BMI) of 33.0 to 33.9 in adult She has lost about 11 pounds since her last visit. Encouraged to continue following a healthful diet and engaging in regular physical activity. We discussed benefits of regular physical activity.  Essential hypertension, benign BP is adequately controlled. Continue amlodipine-valsartan 10-and 60 mg daily and HCTZ 12.5 mg  daily. Continue low-salt diet.   Hyperlipidemia LDL 203. She is not fasting today, so we will plan on checking fasting lipid panel next visit. She does not recall which statin medication she has tried before. An option will be to take statin a few times during the week, pravastatin or Livalo are good options. We discussed benefits of statin in regard  to CVD prevention.  Return in about 19 weeks (around 08/24/2020) for Fasting labs.  Maniyah Moller G. Martinique, MD  [facility]. [place] office.   A few things to remember from today's visit: No changes today. Let me know if you decide to take cholesterol medication. Continue the good work.  Wt Readings from Last 3 Encounters:  04/13/20 194 lb (88 kg)  11/24/19 205 lb 6 oz (93.2 kg)  02/19/18 196 lb 6 oz (89.1 kg)   If you need refills please call your pharmacy. Do not use My Chart to request refills or for acute issues that need immediate attention.    Please be sure medication list is accurate. If a new problem present, please set up appointment sooner than planned today.

## 2020-04-13 NOTE — Telephone Encounter (Signed)
I called and spoke with pt. I canceled the appts for [place], pt is not a patient there nor has she ever been referred there by [place]. We went over her question on her medication & she verbalized understanding.

## 2020-04-13 NOTE — Assessment & Plan Note (Signed)
LDL 203. She is not fasting today, so we will plan on checking fasting lipid panel next visit. She does not recall which statin medication she has tried before. An option will be to take statin a few times during the week, pravastatin or Livalo are good options. We discussed benefits of statin in regard to CVD prevention.

## 2020-04-13 NOTE — Assessment & Plan Note (Signed)
BP is adequately controlled. Continue amlodipine-valsartan 10-and 60 mg daily and HCTZ 12.5 mg daily. Continue low-salt diet.

## 2020-04-13 NOTE — Patient Instructions (Signed)
A few things to remember from today's visit: No changes today. Let me know if you decide to take cholesterol medication. Continue the good work.  Wt Readings from Last 3 Encounters:  04/13/20 194 lb (88 kg)  11/24/19 205 lb 6 oz (93.2 kg)  02/19/18 196 lb 6 oz (89.1 kg)   If you need refills please call your pharmacy. Do not use My Chart to request refills or for acute issues that need immediate attention.    Please be sure medication list is accurate. If a new problem present, please set up appointment sooner than planned today.

## 2020-05-16 ENCOUNTER — Ambulatory Visit: Payer: Medicare Other | Admitting: Oncology

## 2020-05-16 ENCOUNTER — Other Ambulatory Visit: Payer: Medicare Other

## 2020-05-24 ENCOUNTER — Telehealth: Payer: Self-pay

## 2020-05-24 DIAGNOSIS — E785 Hyperlipidemia, unspecified: Secondary | ICD-10-CM

## 2020-05-24 DIAGNOSIS — I10 Essential (primary) hypertension: Secondary | ICD-10-CM

## 2020-05-24 NOTE — Telephone Encounter (Signed)
-----   Message from Viona Gilmore, [facility] sent at 05/24/2020  4:50 PM EST ----- Regarding: CCM referral Hi Geraldina Parrott,  Can you please place a CCM referral for Ms. Butch Penny?  Thank you! Maddie

## 2020-05-25 ENCOUNTER — Ambulatory Visit: Payer: Medicare Other | Admitting: Pharmacist

## 2020-05-25 ENCOUNTER — Ambulatory Visit: Payer: Medicare Other | Admitting: Family Medicine

## 2020-05-25 DIAGNOSIS — I10 Essential (primary) hypertension: Secondary | ICD-10-CM

## 2020-05-25 DIAGNOSIS — E785 Hyperlipidemia, unspecified: Secondary | ICD-10-CM

## 2020-05-25 NOTE — Chronic Care Management (AMB) (Signed)
Chronic Care Management Pharmacy  Name: Kristen Wolf  MRN: [id number] DOB: 02/26/1951  Initial Planning Appointment: n/a   Initial Questions: 1. Have you seen any other providers since your last visit? n/a 2. Any changes in your medicines or health? No   Chief Complaint/ HPI  Kristen Wolf,  69 y.o. , female presents for their Initial CCM visit with the clinical pharmacist via telephone due to COVID-19 Pandemic.  PCP : Martinique, Betty G, MD  Their chronic conditions include: HTN, HLD, allergic rhinitis, osteopenia  Office Visits: -04/13/20 Betty Martinique, MD: Patient presented for chronic conditions follow up. Follow up in 5 months for fasting labs.  -11/24/19 Betty Martinique, MD: Patient presented for medicare annual wellness exam. BMP showed low K and vitamin D higher end of normal.  Consult Visit: -10/05/19 Teodoro Spray (optometry): Patient presented for eye exam.  Medications: Outpatient Encounter Medications as of 05/25/2020  Medication Sig  . amLODipine-valsartan (EXFORGE) 10-160 MG tablet Take 1 tablet by mouth daily.  . Calcium-Magnesium (CAL-MAG PO) Take 1,000 mg by mouth every morning. Powder   . fluticasone (FLONASE) 50 MCG/ACT nasal spray Place 1 spray into both nostrils daily. (Patient taking differently: Place 1 spray into both nostrils daily as needed. )  . hydrochlorothiazide (MICROZIDE) 12.5 MG capsule Take 1 capsule by mouth once daily in the morning  . IRON PO Take 65 mg by mouth every other day.   . Magnesium 250 MG TABS Take 250 mg by mouth daily.  . potassium gluconate 595 (99 K) MG TABS tablet Take 595 mg by mouth.  . [DISCONTINUED] cholecalciferol (VITAMIN D) 1000 UNITS tablet Take 1,000 Units by mouth daily.   . [DISCONTINUED] co-enzyme Q-10 30 MG capsule Take 30 mg by mouth daily.     No facility-administered encounter medications on file as of 05/25/2020.   Patient reports that since her last visit she has made some dietary changes. She is  following a low-carb diet, has increased her vegetable intake, and walking almost daily. For protein, she eats mostly chicken, beef occasionally, sometimes sausage, some days she will have yogurt or bacon or meat for breakfast, and eats salmon once a week, or flounder or perch. She has also noted weight loss. Patient doesn't usually eat out due to cost and the ability to be more healthy at home.  Patient gets up early in the morning and goes walking in the morning and then takes medications. Patient lives alone but has a daughter in [place] and she is able to talk to her about every day. She  walks for an hour at the park almost every day and mixes up her route.  Patient doesn't sleep as many hours as she should. Patient has no trouble falling asleep or staying asleep and sleeps for about 6-8 hours a night. She does feel mostly tired and takes naps occasionally when she is watching TV.  In her spare time, she enjoys crocheting hats, making jewelry, and making quilt tops and clothes.  Patient reports her body is comfortable with her current medications.   Current Diagnosis/Assessment:  Goals Addressed            This Visit's Progress   . Pharmacy Care Plan       CARE PLAN ENTRY (see longitudinal plan of care for additional care plan information)  Current Barriers:  . Chronic Disease Management support, education, and care coordination needs related to Hypertension, Hyperlipidemia, Osteopenia, and Allergic Rhinitis   Hypertension BP Readings from Last  3 Encounters:  04/13/20 122/80  11/24/19 120/72  02/19/18 140/80   . Pharmacist Clinical Goal(s): o Over the next 180 days, patient will work with PharmD and providers to maintain BP goal <140/90 . Current regimen:  . Amlodipine-valsartan 10-160 mg 1 tablet daily . Hydrochlorothiazide 12.5 mg 1 capsule daily in the morning . Interventions: o Discussed DASH eating plan recommendations: . Emphasizes vegetables, fruits, and  whole-grains . Includes fat-free or low-fat dairy products, fish, poultry, beans, nuts, and vegetable oils . Limits foods that are high in saturated fat. These foods include fatty meats, full-fat dairy products, and tropical oils such as coconut, palm kernel, and palm oils. . Limits sugar-sweetened beverages and sweets . Limiting sodium intake to < 1500 mg/day o Discussed recommendations for moderate aerobic exercise for 150 minutes/week spread out over 5 days for heart healthy lifestyle  . Patient self care activities - Over the next 180 days, patient will: o Check blood pressure a few times a month, document, and provide at future appointments o Ensure daily salt intake < 2300 mg/day  Hyperlipidemia LDL 203, HDL 47, Triglycerides 193 . Pharmacist Clinical Goal(s): o Over the next 180 days, patient will work with PharmD and providers to achieve LDL goal < 100 . Current regimen:  o No medications . Interventions: o Discussed the possibility of trying a different cholesterol medication from the statins that does not have the risk of causing gout (medicine called Zetia) o Discussed lowering cholesterol through diet by: Marland Kitchen Limiting foods with cholesterol such as liver and other organ meats, egg yolks, shrimp, and whole milk dairy products . Avoiding saturated fats and trans fats and incorporating healthier fats, such as lean meat, nuts, and unsaturated oils like canola and olive oils . Eating foods with soluble fiber such as whole-grain cereals such as oatmeal and oat bran, fruits such as apples, bananas, oranges, pears, and prunes, legumes such as kidney beans, lentils, chick peas, black-eyed peas, and lima beans, and green leafy vegetables . Limiting alcohol intake . Patient self care activities - Over the next 180 days, patient will: o Continue working on diet and exercise modifications  Allergic rhinitis . Pharmacist Clinical Goal(s): o Over the next 180 days, patient will work with  PharmD and providers to manage symptoms of allergies . Current regimen:  o Flonase 50 mcg/act 1 spray into both nostrils daily as needed . Interventions: o Discussed avoiding allergy triggers . Patient self care activities - Over the next 180 days, patient will: o Continue current medication  Osteopenia . Pharmacist Clinical Goal(s) o Over the next 180 days, patient will work with PharmD and providers to improve bone density and prevent fractures . Current regimen:  . Calcium 1000 mg - mag 400 mg 1 tablet every morning . Clyde liver oil with vitamin D . Interventions: o Discussed the recommendations for 000 units of vitamin D daily and 1200 mg of calcium daily from dietary and supplemental sources. . Patient self care activities - Over the next 180 days, patient will: o Ensure she is getting the recommended amount of vitamin D (in her cod liver oil) and to supplement with additional vitamin D if necessary to get to 1000 units daily  Medication management . Pharmacist Clinical Goal(s): o Over the next 180 days, patient will work with PharmD and providers to maintain optimal medication adherence . Current [facility]: [facility] . Interventions o Comprehensive medication review performed. o Continue current medication management strategy . Patient self care activities - Over the next  180 days, patient will: o Take medications as prescribed o Report any questions or concerns to PharmD and/or provider(s)  Initial goal documentation       SDOH Interventions   Flowsheet Row Most Recent Value  SDOH Interventions   Financial Strain Interventions Intervention Not Indicated  Transportation Interventions Intervention Not Indicated      Hypertension   BP goal is:  <140/90  Office blood pressures are  BP Readings from Last 3 Encounters:  04/13/20 122/80  11/24/19 120/72  02/19/18 140/80   Patient checks BP at home rarely Patient home BP readings are ranging: n/a  Patient has failed  these meds in the past: none Patient is currently controlled on the following medications:  . Amlodipine-valsartan 10-160 mg 1 tablet daily . Hydrochlorothiazide 12.5 mg 1 capsule daily in the morning  We discussed diet and exercise extensively -Diet: watching sodium and very seldom adds salt; eats mostly fresh vegetables and uses some sea salt -DASH eating plan recommendations: . Emphasizes vegetables, fruits, and whole-grains . Includes fat-free or low-fat dairy products, fish, poultry, beans, nuts, and vegetable oils . Limits foods that are high in saturated fat. These foods include fatty meats, full-fat dairy products, and tropical oils such as coconut, palm kernel, and palm oils. . Limits sugar-sweetened beverages and sweets . Limiting sodium intake to < 1500 mg/day -Discussed recommendations for moderate aerobic exercise for 150 minutes/week spread out over 5 days for heart healthy lifestyle -Endorses occasional dizziness/lightheadedness - recommended checking BP during these episodes  Plan Patient will start checking blood pressure at home more regularly. Continue current medications     Hyperlipidemia   LDL goal < 100  Last lipids No results found for: CHOL, HDL, LDLCALC, LDLDIRECT, TRIG, CHOLHDL No flowsheet data found.   10/2016:  TC 289, LDL 203, HDL 47, TG 193  The ASCVD Risk score (Goff DC Jr., et al., 2013) failed to calculate for the following reasons:   Cannot find a previous HDL lab   Cannot find a previous total cholesterol lab   Patient has failed these meds in past: atorvastatin, pravastatin Patient is currently controlled on the following medications:  . No medications  We discussed:  diet and exercise extensively  -Benefits of cholesterol medication on lowering risk of CV events -Lowering cholesterol through diet by: Marland Kitchen Limiting foods with cholesterol such as liver and other organ meats, egg yolks, shrimp, and whole milk dairy products . Avoiding  saturated fats and trans fats and incorporating healthier fats, such as lean meat, nuts, and unsaturated oils like canola and olive oils . Eating foods with soluble fiber such as whole-grain cereals such as oatmeal and oat bran, fruits such as apples, bananas, oranges, pears, and prunes, legumes such as kidney beans, lentils, chick peas, black-eyed peas, and lima beans, and green leafy vegetables . Limiting alcohol intake -Dicussed the mechanism of action of Zetia compared to the statin medications and the likelihood of having gout with this medication (Zetia does not increase your risk of developing gout or your uric acid level) - some statins (atorvastatin and simvastatin) have data that they reduce chances of developing gout by reducing uric acid levels  Plan Recommend checking uric acid level prior to starting cholesterol medication.  Continue control with diet and exercise  Allergic rhinitis   Patient has failed these meds in past: none Patient is currently controlled on the following medications:  . Flonase 50 mcg/act 1 spray into both nostrils daily PRN  We discussed:  avoiding allergies -  patient uses when sinuses bother her  Plan  Continue current medications   Osteopenia   Last DEXA Scan: 01/20/2018  T-Score femoral neck: LFN -1.6  T-Score total hip: n/a  T-Score lumbar spine: 0.7  T-Score forearm radius: n/a  10-year probability of major osteoporotic fracture: 4.1%  10-year probability of hip fracture: 0.5%  VITD  Date Value Ref Range Status  12/08/2019 96.92 30.00 - 100.00 ng/mL Final     Patient is not a candidate for pharmacologic treatment  Patient has failed these meds in past: none Patient is currently controlled on the following medications:  . Calcium 1000 mg - mag 400 mg 1 tablet every morning . Cod liver oil with vitamin D (not sure how much is in there)  We discussed:  Recommend [phone] units of vitamin D daily. Recommend 1200 mg of calcium daily from  dietary and supplemental sources. Recommend weight-bearing and muscle strengthening exercises for building and maintaining bone density.  Plan  Continue current medications   Vitamins/supplements   Patient is currently on the following medications:  Marland Kitchen Magnesium 250 mg 1 tablet daily . Iron 65 mg 1 tablet every daily . Potassium 50 mg . Vital protein (collagen) teaspoon  Plan  Continue current medications   Vaccines   Reviewed and discussed patient's vaccination history.    Immunization History  Administered Date(s) Administered  . Fluad Quad(high Dose 65+) 02/29/2020  . Influenza, High Dose Seasonal PF 04/22/2018, 04/14/2019  . PFIZER SARS-COV-2 Vaccination 08/09/2019, 09/02/2019, 04/07/2020  . Pneumococcal Conjugate-13 08/16/2014  . Pneumococcal Polysaccharide-23 05/01/2017  . Tdap 01/15/2007   Verified Prevnar vaccine and previous tetanus vaccine with [facility] and added to immunization history.  Patient does not wish to get the Shingles vaccine at this time as she has heard other people getting side effects from it.   Plan  Recommended patient receive Shingles and tetanus vaccine in office/at pharmacy.   Medication Management   Patient's preferred pharmacy is:  [id number] - [place], [address] [address] Phone: [phone] Fax: [phone]  Uses pill box? Yes  Pt endorses 99% compliance - rarely  We discussed: Current pharmacy is preferred with insurance plan and patient is satisfied with pharmacy services   Plan  Continue current medication management strategy  Follow up: 6 month phone visit  [name], PharmD [place] Pharmacist [facility] [place] [phone]

## 2020-06-08 NOTE — Patient Instructions (Addendum)
Hi Kristen Wolf!  It was lovely to get to speak with you over the phone! Below is a summary of some of the topics we discussed. I also attached some information about the cholesterol medication we discussed that is not a statin. I do really think you would benefit from taking it in order to lower your risk of having a heart attack or stroke and keep you as healthy as long as possible. Also, please look into getting your tetanus vaccine and keep thinking about getting the shingles vaccine at your earliest convenience.   Please give me a call if you have any questions or need anything from me before our follow up!  Best, Maddie  Kristen Wolf, PharmD [facility] Clinical Pharmacist [place] [facility] [place]   Visit Information  Goals Addressed            This Visit's Progress   . Pharmacy Care Plan       CARE PLAN ENTRY (see longitudinal plan of care for additional care plan information)  Current Barriers:  . Chronic Disease Management support, education, and care coordination needs related to Hypertension, Hyperlipidemia, Osteopenia, and Allergic Rhinitis   Hypertension BP Readings from Last 3 Encounters:  04/13/20 122/80  11/24/19 120/72  02/19/18 140/80   . Pharmacist Clinical Goal(s): o Over the next 180 days, patient will work with PharmD and providers to maintain BP goal <140/90 . Current regimen:  . Amlodipine-valsartan 10-160 mg 1 tablet daily . Hydrochlorothiazide 12.5 mg 1 capsule daily in the morning . Interventions: o Discussed DASH eating plan recommendations: . Emphasizes vegetables, fruits, and whole-grains . Includes fat-free or low-fat dairy products, fish, poultry, beans, nuts, and vegetable oils . Limits foods that are high in saturated fat. These foods include fatty meats, full-fat dairy products, and tropical oils such as coconut, palm kernel, and palm oils. . Limits sugar-sweetened beverages and sweets . Limiting sodium intake to < 1500  mg/day o Discussed recommendations for moderate aerobic exercise for 150 minutes/week spread out over 5 days for heart healthy lifestyle  . Patient self care activities - Over the next 180 days, patient will: o Check blood pressure a few times a month, document, and provide at future appointments o Ensure daily salt intake < 2300 mg/day  Hyperlipidemia LDL 203, HDL 47, Triglycerides 193 . Pharmacist Clinical Goal(s): o Over the next 180 days, patient will work with PharmD and providers to achieve LDL goal < 100 . Current regimen:  o No medications . Interventions: o Discussed the possibility of trying a different cholesterol medication from the statins that does not have the risk of causing gout (medicine called Zetia) o Discussed lowering cholesterol through diet by: Marland Kitchen Limiting foods with cholesterol such as liver and other organ meats, egg yolks, shrimp, and whole milk dairy products . Avoiding saturated fats and trans fats and incorporating healthier fats, such as lean meat, nuts, and unsaturated oils like canola and olive oils . Eating foods with soluble fiber such as whole-grain cereals such as oatmeal and oat bran, fruits such as apples, bananas, oranges, pears, and prunes, legumes such as kidney beans, lentils, chick peas, black-eyed peas, and lima beans, and green leafy vegetables . Limiting alcohol intake . Patient self care activities - Over the next 180 days, patient will: o Continue working on diet and exercise modifications  Allergic rhinitis . Pharmacist Clinical Goal(s): o Over the next 180 days, patient will work with PharmD and providers to manage symptoms of allergies . Current regimen:  o Flonase 50 mcg/act 1 spray into both nostrils daily as needed . Interventions: o Discussed avoiding allergy triggers . Patient self care activities - Over the next 180 days, patient will: o Continue current medication  Osteopenia . Pharmacist Clinical Goal(s) o Over the next 180  days, patient will work with PharmD and providers to improve bone density and prevent fractures . Current regimen:  . Calcium 1000 mg - mag 400 mg 1 tablet every morning . [place] liver oil with vitamin D . Interventions: o Discussed the recommendations for 000 units of vitamin D daily and 1200 mg of calcium daily from dietary and supplemental sources. . Patient self care activities - Over the next 180 days, patient will: o Ensure she is getting the recommended amount of vitamin D (in her cod liver oil) and to supplement with additional vitamin D if necessary to get to 1000 units daily  Medication management . Pharmacist Clinical Goal(s): o Over the next 180 days, patient will work with PharmD and providers to maintain optimal medication adherence . Current pharmacy: [facility] . Interventions o Comprehensive medication review performed. o Continue current medication management strategy . Patient self care activities - Over the next 180 days, patient will: o Take medications as prescribed o Report any questions or concerns to PharmD and/or provider(s)  Initial goal documentation        Kristen Wolf was given information about Chronic Care Management services today including:  1. CCM service includes personalized support from designated clinical staff supervised by her physician, including individualized plan of care and coordination with other care providers 2. 24/7 contact phone numbers for assistance for urgent and routine care needs. 3. Standard insurance, coinsurance, copays and deductibles apply for chronic care management only during months in which we provide at least 20 minutes of these services. Most insurances cover these services at 100%, however patients may be responsible for any copay, coinsurance and/or deductible if applicable. This service may help you avoid the need for more expensive face-to-face services. 4. Only one practitioner may furnish and bill the service in a calendar  month. 5. The patient may stop CCM services at any time (effective at the end of the month) by phone call to the office staff.  Patient agreed to services and verbal consent obtained.   The patient verbalized understanding of instructions, educational materials, and care plan provided today and agreed to receive a mailed copy of patient instructions, educational materials, and care plan.  Telephone follow up appointment with pharmacy team member scheduled for: 6 months  Viona Gilmore, [facility] Ezetimibe Tablets What is this medicine? EZETIMIBE (ez ET i mibe) blocks the absorption of cholesterol from the stomach. It can help lower blood cholesterol for patients who are at risk of getting heart disease or a stroke. It is only for patients whose cholesterol level is not controlled by diet. This medicine may be used for other purposes; ask your health care provider or pharmacist if you have questions. COMMON BRAND NAME(S): Zetia What should I tell my health care provider before I take this medicine? They need to know if you have any of these conditions:  liver disease  an unusual or allergic reaction to ezetimibe, medicines, foods, dyes, or preservatives  pregnant or trying to get pregnant  breast-feeding How should I use this medicine? Take this medicine by mouth with a glass of water. Follow the directions on the prescription label. This medicine can be taken with or without food. Take your doses at  regular intervals. Do not take your medicine more often than directed. Talk to your pediatrician regarding the use of this medicine in children. Special care may be needed. Overdosage: If you think you have taken too much of this medicine contact a poison control center or emergency room at once. NOTE: This medicine is only for you. Do not share this medicine with others. What if I miss a dose? If you miss a dose, take it as soon as you can. If it is almost time for your next dose, take only  that dose. Do not take double or extra doses. What may interact with this medicine? Do not take this medicine with any of the following medications:  fenofibrate  gemfibrozil This medicine may also interact with the following medications:  antacids  cyclosporine  herbal medicines like red yeast rice  other medicines to lower cholesterol or triglycerides This list may not describe all possible interactions. Give your health care provider a list of all the medicines, herbs, non-prescription drugs, or dietary supplements you use. Also tell them if you smoke, drink alcohol, or use illegal drugs. Some items may interact with your medicine. What should I watch for while using this medicine? Visit your doctor or health care professional for regular checks on your progress. You will need to have your cholesterol levels checked. If you are also taking some other cholesterol medicines, you will also need to have tests to make sure your liver is working properly. Tell your doctor or health care professional if you get any unexplained muscle pain, tenderness, or weakness, especially if you also have a fever and tiredness. You need to follow a low-cholesterol, low-fat diet while you are taking this medicine. This will decrease your risk of getting heart and blood vessel disease. Exercising and avoiding alcohol and smoking can also help. Ask your doctor or dietician for advice. What side effects may I notice from receiving this medicine? Side effects that you should report to your doctor or health care professional as soon as possible:  allergic reactions like skin rash, itching or hives, swelling of the face, lips, or tongue  dark yellow or brown urine  unusually weak or tired  yellowing of the skin or eyes Side effects that usually do not require medical attention (report to your doctor or health care professional if they continue or are bothersome):  diarrhea  dizziness  headache  stomach  upset or pain This list may not describe all possible side effects. Call your doctor for medical advice about side effects. You may report side effects to FDA at 1-800-FDA-1088. Where should I keep my medicine? Keep out of the reach of children. Store at room temperature between 15 and 30 degrees C (59 and 86 degrees F). Protect from moisture. Keep container tightly closed. Throw away any unused medicine after the expiration date. NOTE: This sheet is a summary. It may not cover all possible information. If you have questions about this medicine, talk to your doctor, pharmacist, or health care provider.  2020 Elsevier/Gold Standard (2011-12-10 15:39:09)  Dyslipidemia Dyslipidemia is an imbalance of waxy, fat-like substances (lipids) in the blood. The body needs lipids in small amounts. Dyslipidemia often involves a high level of cholesterol or triglycerides, which are types of lipids. Common forms of dyslipidemia include:  High levels of LDL cholesterol. LDL is the type of cholesterol that causes fatty deposits (plaques) to build up in the blood vessels that carry blood away from your heart (arteries).  Low levels of  HDL cholesterol. HDL cholesterol is the type of cholesterol that protects against heart disease. High levels of HDL remove the LDL buildup from arteries.  High levels of triglycerides. Triglycerides are a fatty substance in the blood that is linked to a buildup of plaques in the arteries. What are the causes? Primary dyslipidemia is caused by changes (mutations) in genes that are passed down through families (inherited). These mutations cause several types of dyslipidemia. Secondary dyslipidemia is caused by lifestyle choices and diseases that lead to dyslipidemia, such as:  Eating a diet that is high in animal fat.  Not getting enough exercise.  Having diabetes, kidney disease, liver disease, or thyroid disease.  Drinking large amounts of alcohol.  Using certain  medicines. What increases the risk? You are more likely to develop this condition if you are an older man or if you are a woman who has gone through menopause. Other risk factors include:  Having a family history of dyslipidemia.  Taking certain medicines, including birth control pills, steroids, some diuretics, and beta-blockers.  Smoking cigarettes.  Eating a high-fat diet.  Having certain medical conditions such as diabetes, polycystic ovary syndrome (PCOS), kidney disease, liver disease, or hypothyroidism.  Not exercising regularly.  Being overweight or obese with too much belly fat. What are the signs or symptoms? In most cases, dyslipidemia does not usually cause any symptoms. In severe cases, very high lipid levels can cause:  Fatty bumps under the skin (xanthomas).  White or gray ring around the black center (pupil) of the eye. Very high triglyceride levels can cause inflammation of the pancreas (pancreatitis). How is this diagnosed? Your health care provider may diagnose dyslipidemia based on a routine blood test (fasting blood test). Because most people do not have symptoms of the condition, this blood testing (lipid profile) is done on adults age 26 and older and is repeated every 5 years. This test checks:  Total cholesterol. This measures the total amount of cholesterol in your blood, including LDL cholesterol, HDL cholesterol, and triglycerides. A healthy number is below 200.  LDL cholesterol. The target number for LDL cholesterol is different for each person, depending on individual risk factors. Ask your health care provider what your LDL cholesterol should be.  HDL cholesterol. An HDL level of 60 or higher is best because it helps to protect against heart disease. A number below 29 for men or below 45 for women increases the risk for heart disease.  Triglycerides. A healthy triglyceride number is below 150. If your lipid profile is abnormal, your health care  provider may do other blood tests. How is this treated? Treatment depends on the type of dyslipidemia that you have and your other risk factors for heart disease and stroke. Your health care provider will have a target range for your lipid levels based on this information. For many people, this condition may be treated by lifestyle changes, such as diet and exercise. Your health care provider may recommend that you:  Get regular exercise.  Make changes to your diet.  Quit smoking if you smoke. If diet changes and exercise do not help you reach your goals, your health care provider may also prescribe medicine to lower lipids. The most commonly prescribed type of medicine lowers your LDL cholesterol (statin drug). If you have a high triglyceride level, your provider may prescribe another type of drug (fibrate) or an omega-3 fish oil supplement, or both. Follow these instructions at home:  Eating and drinking  Follow instructions from your  health care provider or dietitian about eating or drinking restrictions.  Eat a healthy diet as told by your health care provider. This can help you reach and maintain a healthy weight, lower your LDL cholesterol, and raise your HDL cholesterol. This may include: ? Limiting your calories, if you are overweight. ? Eating more fruits, vegetables, whole grains, fish, and lean meats. ? Limiting saturated fat, trans fat, and cholesterol.  If you drink alcohol: ? Limit how much you use. ? Be aware of how much alcohol is in your drink. In the [place] one drink equals one 12 oz bottle of beer (355 mL), one 5 oz glass of wine (148 mL), or one 1 oz glass of hard liquor (44 mL).  Do not drink alcohol if: ? Your health care provider tells you not to drink. ? You are pregnant, may be pregnant, or are planning to become pregnant. Activity  Get regular exercise. Start an exercise and strength training program as told by your health care provider. Ask your health care  provider what activities are safe for you. Your health care provider may recommend: ? 30 minutes of aerobic activity 4-6 days a week. Brisk walking is an example of aerobic activity. ? Strength training 2 days a week. General instructions  Do not use any products that contain nicotine or tobacco, such as cigarettes, e-cigarettes, and chewing tobacco. If you need help quitting, ask your health care provider.  Take over-the-counter and prescription medicines only as told by your health care provider. This includes supplements.  Keep all follow-up visits as told by your health care provider. Contact a health care provider if:  You are: ? Having trouble sticking to your exercise or diet plan. ? Struggling to quit smoking or control your use of alcohol. Summary  Dyslipidemia often involves a high level of cholesterol or triglycerides, which are types of lipids.  Treatment depends on the type of dyslipidemia that you have and your other risk factors for heart disease and stroke.  For many people, treatment starts with lifestyle changes, such as diet and exercise.  Your health care provider may prescribe medicine to lower lipids. This information is not intended to replace advice given to you by your health care provider. Make sure you discuss any questions you have with your health care provider. Document Revised: 01/27/2018 Document Reviewed: 01/03/2018 Elsevier Patient Education  [place].

## 2020-08-23 ENCOUNTER — Other Ambulatory Visit: Payer: Self-pay

## 2020-08-23 DIAGNOSIS — I10 Essential (primary) hypertension: Secondary | ICD-10-CM

## 2020-08-23 DIAGNOSIS — E785 Hyperlipidemia, unspecified: Secondary | ICD-10-CM

## 2020-08-24 ENCOUNTER — Other Ambulatory Visit (INDEPENDENT_AMBULATORY_CARE_PROVIDER_SITE_OTHER): Payer: Medicare Other

## 2020-08-24 ENCOUNTER — Other Ambulatory Visit: Payer: Self-pay

## 2020-08-24 DIAGNOSIS — E785 Hyperlipidemia, unspecified: Secondary | ICD-10-CM

## 2020-08-24 DIAGNOSIS — I10 Essential (primary) hypertension: Secondary | ICD-10-CM | POA: Diagnosis not present

## 2020-08-24 LAB — LIPID PANEL
Cholesterol: 277 mg/dL — ABNORMAL HIGH (ref 0–200)
HDL: 67.7 mg/dL (ref >39.00)
LDL Cholesterol: 189 mg/dL — ABNORMAL HIGH (ref 0–99)
NonHDL: 209.22
Total CHOL/HDL Ratio: 4
Triglycerides: 103 mg/dL (ref 0.0–149.0)
VLDL: 20.6 mg/dL (ref 0.0–40.0)

## 2020-08-24 LAB — BASIC METABOLIC PANEL
BUN: 19 mg/dL (ref 6–23)
CO2: 30 mEq/L (ref 19–32)
Calcium: 10 mg/dL (ref 8.4–10.5)
Chloride: 100 mEq/L (ref 96–112)
Creatinine, Ser: 1.12 mg/dL (ref 0.40–1.20)
GFR: 49.59 mL/min — ABNORMAL LOW (ref >60.00)
Glucose, Bld: 87 mg/dL (ref 70–99)
Potassium: 3.4 mEq/L — ABNORMAL LOW (ref 3.5–5.1)
Sodium: 140 mEq/L (ref 135–145)

## 2020-08-29 ENCOUNTER — Encounter: Payer: Self-pay | Admitting: Family Medicine

## 2020-08-29 ENCOUNTER — Telehealth (INDEPENDENT_AMBULATORY_CARE_PROVIDER_SITE_OTHER): Payer: Medicare Other | Admitting: Family Medicine

## 2020-08-29 VITALS — Ht 61.0 in

## 2020-08-29 DIAGNOSIS — W19XXXA Unspecified fall, initial encounter: Secondary | ICD-10-CM

## 2020-08-29 DIAGNOSIS — Y92009 Unspecified place in unspecified non-institutional (private) residence as the place of occurrence of the external cause: Secondary | ICD-10-CM

## 2020-08-29 DIAGNOSIS — E785 Hyperlipidemia, unspecified: Secondary | ICD-10-CM

## 2020-08-29 DIAGNOSIS — E876 Hypokalemia: Secondary | ICD-10-CM

## 2020-08-29 DIAGNOSIS — I10 Essential (primary) hypertension: Secondary | ICD-10-CM | POA: Diagnosis not present

## 2020-08-29 NOTE — Progress Notes (Signed)
Virtual Visit via Telephone Note  I connected with Kristen Wolf on 08/29/20 at  2:00 PM EDT by telephone and verified that I am speaking with the correct person using two identifiers.  We tried Caregility 3 times, did not work. I discussed the limitations, risks, security and privacy concerns of performing an evaluation and management service by telephone and the availability of in person appointments. I also discussed with the patient that there may be a patient responsible charge related to this service. The patient expressed understanding and agreed to proceed.  Location patient: home Location provider: work office Participants present for the call: patient, provider Patient did not have a visit in the prior 7 days to address this/these issue(s).   History of Present Illness: Kristen Wolf is a pleasant 70 yo female with hx of HTN,HLD,CKD,and allergic rhinitis following on some of her chronic medical problems. She had blood work recently.  HTN: She is on HCTZ 12.5 mg daily and Amlodipine-Valsartan 10-160 mg daily.. She is not checking BP because she is feeling "good." Negative for severe/frequent headache, visual changes, chest pain, dyspnea, palpitation, claudication, focal weakness, or edema.   CKD III: She has not noted gross hematuria,foam in urine,or decreased urine output. A few weeks ago she fell in her bedroom, hit great toe against bookcase, developed gout like symptoms. She has taken Ibuprofen, improved. She still has a bruise in her breast but gradually fading.  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  HLD:She is on non pharmacologic treatment.She has tried different statin meds, including Lipitor,but she has not tolerated well. Stating medications caused myalgias and aggravated  arthralgias.  She is trying to follow a healthier diet, low salt and plenty of vegetables. Now that her foot is better she is trying to go back to walking a few times per week.  Lab Results  Component Value Date   CHOL 277 (H) 08/24/2020   HDL 67.70 08/24/2020   LDLCALC 189 (H) 08/24/2020   TRIG 103.0 08/24/2020   CHOLHDL 4 08/24/2020    Observations/Objective: Patient sounds cheerful and well on the phone. I do not appreciate any SOB. Speech and thought processing are grossly intact. Patient reported vitals:Ht 5\' 1"  (1.549 m)    BMI 36.66 kg/m   Assessment and Plan:  Hyperlipidemia, unspecified hyperlipidemia type She has not tolerated statins in the past, we discussed some benefits. She prefers to continue working on low fat diet and increasing physical activity.  The 10-year ASCVD risk score Kristen Bussing DC Brooke Bonito., Kristen al., Kristen Wolf) is: 15.9%   Values used to calculate the score:     Age: 30 years     Sex: Female     Is Non-Hispanic African American: Yes     Diabetic: No     Tobacco smoker: No     Systolic Blood Pressure: 269 mmHg     Is BP treated: Yes     HDL Cholesterol: 67.7 mg/dL     Total Cholesterol: 277 mg/dL  Essential hypertension, benign Monitor BP periodically. Continue HCTZ 12.5 mg daily and Amlodipine-Valsartan 10-160 mg daily. Low salt diet.  Hypokalemia Mild. She is not interested in [place], prefers to take an extra K+ gluconate every other day.  Fall in home, initial encounter Fall precautions discussed.  Some side effects of NSAID's discussed, recommend taking Tylenol instead. For renal protection also recommend continuing adequate hydration and low salt diet.  Follow Up Instructions:  Return in about 4 months (around 12/29/2020).  I did not refer this patient for an OV in the next 24 hours for this/these issue(s).  I discussed the assessment and treatment plan with the patient. Kristen Wolf was provided an opportunity to ask questions and all were  answered. She agreed with the plan and demonstrated an understanding of the instructions.   I provided 24 minutes of non-face-to-face time during this encounter.   Wylodean Shimmel Martin[place], MD

## 2020-09-05 NOTE — Progress Notes (Signed)
Patient scheduled for 01/06/2021

## 2020-09-21 DIAGNOSIS — Z23 Encounter for immunization: Secondary | ICD-10-CM | POA: Diagnosis not present

## 2020-10-19 DIAGNOSIS — I10 Essential (primary) hypertension: Secondary | ICD-10-CM | POA: Diagnosis not present

## 2020-10-19 DIAGNOSIS — H35033 Hypertensive retinopathy, bilateral: Secondary | ICD-10-CM | POA: Diagnosis not present

## 2020-10-19 DIAGNOSIS — H2513 Age-related nuclear cataract, bilateral: Secondary | ICD-10-CM | POA: Diagnosis not present

## 2020-10-19 DIAGNOSIS — H5203 Hypermetropia, bilateral: Secondary | ICD-10-CM | POA: Diagnosis not present

## 2020-10-19 DIAGNOSIS — H524 Presbyopia: Secondary | ICD-10-CM | POA: Diagnosis not present

## 2020-10-19 DIAGNOSIS — H52222 Regular astigmatism, left eye: Secondary | ICD-10-CM | POA: Diagnosis not present

## 2020-11-17 ENCOUNTER — Telehealth: Payer: Medicare Other

## 2020-11-25 ENCOUNTER — Other Ambulatory Visit: Payer: Self-pay

## 2020-11-25 ENCOUNTER — Ambulatory Visit (INDEPENDENT_AMBULATORY_CARE_PROVIDER_SITE_OTHER): Payer: Medicare Other

## 2020-11-25 DIAGNOSIS — Z1231 Encounter for screening mammogram for malignant neoplasm of breast: Secondary | ICD-10-CM

## 2020-11-25 DIAGNOSIS — Z780 Asymptomatic menopausal state: Secondary | ICD-10-CM

## 2020-11-25 DIAGNOSIS — Z1211 Encounter for screening for malignant neoplasm of colon: Secondary | ICD-10-CM

## 2020-11-25 DIAGNOSIS — Z0000 Encounter for general adult medical examination without abnormal findings: Secondary | ICD-10-CM

## 2020-11-25 NOTE — Progress Notes (Signed)
Subjective:   Camani Sesay is a 70 y.o. female who presents for Medicare Annual (Subsequent) preventive examination.  Review of Systems   n/a       Objective:    There were no vitals filed for this visit. There is no height or weight on file to calculate BMI.  Advanced Directives 02/05/2018 01/05/2017 05/15/2016 04/27/2016  Does Patient Have a Medical Advance Directive? No No No No  Would patient like information on creating a medical advance directive? - No - Patient declined - -    Current Medications (verified) Outpatient Encounter Medications as of 11/25/2020  Medication Sig   amLODipine-valsartan (EXFORGE) 10-160 MG tablet Take 1 tablet by mouth daily.   Calcium-Magnesium (CAL-MAG PO) Take 1,000 mg by mouth every morning. Powder   fluticasone (FLONASE) 50 MCG/ACT nasal spray Place 1 spray into both nostrils daily. (Patient taking differently: Place 1 spray into both nostrils daily as needed.)   hydrochlorothiazide (MICROZIDE) 12.5 MG capsule Take 1 capsule by mouth once daily in the morning   IRON PO Take 65 mg by mouth every other day.    Magnesium 250 MG TABS Take 250 mg by mouth daily.   potassium gluconate 595 (99 K) MG TABS tablet Take 595 mg by mouth.   No facility-administered encounter medications on file as of 11/25/2020.    Allergies (verified) Patient has no active allergies.   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   Cyst (solitary) of breast    L breast cyst, mamogram in September "unchanged"   Gout    Hyperlipidemia    Hypertension    Kidney cysts    Liver cyst    Past Surgical History:  Procedure Laterality Date   ABDOMINAL HYSTERECTOMY     1993   APPENDECTOMY     1980   COLONOSCOPY  03/2011   Hx rectal cancer   PARATHYROIDECTOMY     2005   PARATHYROIDECTOMY     2006   POLYPECTOMY     TUBAL LIGATION     1980   TUBAL  LIGATION     Family History  Problem Relation Age of Onset   Asthma Mother    Hypertension Father    Multiple myeloma Sister    Colon cancer Neg Hx    Esophageal cancer Neg Hx    Stomach cancer Neg Hx    Social History   Socioeconomic History   Marital status: Divorced    Spouse name: Not on file   Number of children: Not on file   Years of education: Not on file   Highest education level: Not on file  Occupational History   Not on file  Tobacco Use   Smoking status: Never   Smokeless tobacco: Never  Vaping Use   Vaping Use: Never used  Substance and Sexual Activity   Alcohol use: No   Drug use: No   Sexual activity: Not on file  Other Topics Concern   Not on file  Social History Narrative   Not on file   Social Determinants of Health   Financial Resource Strain: Low Risk    Difficulty of Paying Living Expenses: Not hard at all  Food Insecurity: Not on file  Transportation Needs: No Transportation Needs   Lack of Transportation (Medical): No   Lack of Transportation (Non-Medical):  No  Physical Activity: Not on file  Stress: Not on file  Social Connections: Not on file    Tobacco Counseling Counseling given: Not Answered   Clinical Intake:                 Diabetic?no         Activities of Daily Living No flowsheet data found.  Patient Care Team: [place], Betty G, MD as PCP - General (Family Medicine) Viona Gilmore, [facility] as Pharmacist (Pharmacist)  Indicate any recent Medical Services you may have received from other than [facility] providers in the past year (date may be approximate).     Assessment:   This is a routine wellness examination for Madilynne.  Hearing/Vision screen No results found.  Dietary issues and exercise activities discussed:     Goals Addressed   None    Depression Screen PHQ 2/9 Scores 11/28/2019 02/05/2018 05/01/2017  PHQ - 2 Score 0 0 0    Fall Risk Fall Risk  11/28/2019 05/13/2019 02/05/2018 01/15/2018   Falls in the past year? 0 0 No No  Comment - Emmi Telephone Survey: data to providers prior to load one the year before last  Emmi Telephone Survey: data to providers prior to load  Number falls in past yr: 0 - - -  Injury with Fall? 0 - - -  Follow up Education provided - - -    FALL RISK PREVENTION PERTAINING TO THE HOME:  Any stairs in or around the home? Yes  If so, are there any without handrails? No  Home free of loose throw rugs in walkways, pet beds, electrical cords, etc? Yes  Adequate lighting in your home to reduce risk of falls? Yes   ASSISTIVE DEVICES UTILIZED TO PREVENT FALLS:  Life alert? No  Use of a cane, walker or w/c? No  Grab bars in the bathroom? No  Shower chair or bench in shower? No  Elevated toilet seat or a handicapped toilet? No    Cognitive Function:    Normal cognitive status assessed by direct observation by this Nurse Health Advisor. No abnormalities found.      Immunizations Immunization History  Administered Date(s) Administered   Fluad Quad(high Dose 65+) 02/29/2020   Influenza, High Dose Seasonal PF 04/22/2018, 04/14/2019   PFIZER(Purple Top)SARS-COV-2 Vaccination 08/09/2019, 09/02/2019, 04/07/2020   PNEUMOCOCCAL CONJUGATE-20 09/21/2020   Pneumococcal Conjugate-13 08/16/2014   Pneumococcal Polysaccharide-23 05/01/2017   Tdap 01/15/2007    TDAP status: Due, Education has been provided regarding the importance of this vaccine. Advised may receive this vaccine at local pharmacy or Health Dept. Aware to provide a copy of the vaccination record if obtained from local pharmacy or Health Dept. Verbalized acceptance and understanding.  Flu Vaccine status: Up to date  Pneumococcal vaccine status: Up to date  Covid-19 vaccine status: Completed vaccines  Qualifies for Shingles Vaccine? Yes   Zostavax completed No   Shingrix Completed?: No.    Education has been provided regarding the importance of this vaccine. Patient has been advised to  call insurance company to determine out of pocket expense if they have not yet received this vaccine. Advised may also receive vaccine at local pharmacy or Health Dept. Verbalized acceptance and understanding.  Screening Tests Health Maintenance  Topic Date Due   Hepatitis C Screening  Never done   Zoster Vaccines- Shingrix (1 of 2) Never done   TETANUS/TDAP  01/14/2017   MAMMOGRAM  01/21/2020   COVID-19 Vaccine (4 - Booster for [employer] series)  08/08/2020   INFLUENZA VACCINE  01/16/2021   COLONOSCOPY (Pts 45-47yr Insurance coverage will need to be confirmed)  05/15/2021   DEXA SCAN  Completed   PNA vac Low Risk Adult  Completed   HPV VACCINES  Aged Out    Health Maintenance  Health Maintenance Due  Topic Date Due   Hepatitis C Screening  Never done   Zoster Vaccines- Shingrix (1 of 2) Never done   TETANUS/TDAP  01/14/2017   MAMMOGRAM  01/21/2020   COVID-19 Vaccine (4 - Booster for PFarm Loopseries) 08/08/2020    Colorectal cancer screening: Referral to GI placed 11/25/2020. Pt aware the office will call re: appt.  Mammogram status: Ordered 11/25/2020. Pt provided with contact info and advised to call to schedule appt.   Bone Density status: Ordered 11/25/2020. Pt provided with contact info and advised to call to schedule appt.  Lung Cancer Screening: (Low Dose CT Chest recommended if Age 71-80years, 30 pack-year currently smoking OR have quit w/in 15years.) does not qualify.   Lung Cancer Screening Referral: n/a  Additional Screening:  Hepatitis C Screening: does qualify  Vision Screening: Recommended annual ophthalmology exams for early detection of glaucoma and other disorders of the eye. Is the patient up to date with their annual eye exam?  Yes  Who is the provider or what is the name of the office in which the patient attends annual eye exams? Dr.Miller If pt is not established with a provider, would they like to be referred to a provider to establish care? No .    Dental Screening: Recommended annual dental exams for proper oral hygiene  Community Resource Referral / Chronic Care Management: CRR required this visit?  No   CCM required this visit?  No      Plan:     I have personally reviewed and noted the following in the patient's chart:   Medical and social history Use of alcohol, tobacco or illicit drugs  Current medications and supplements including opioid prescriptions.  Functional ability and status Nutritional status Physical activity Advanced directives List of other physicians Hospitalizations, surgeries, and ER visits in previous 12 months Vitals Screenings to include cognitive, depression, and falls Referrals and appointments  In addition, I have reviewed and discussed with patient certain preventive protocols, quality metrics, and best practice recommendations. A written personalized care plan for preventive services as well as general preventive health recommendations were provided to patient.     LRandel Pigg LPN   [id number]  Nurse Notes: none

## 2020-11-25 NOTE — Patient Instructions (Signed)
Kristen Wolf , Thank you for taking time to come for your Medicare Wellness Visit. I appreciate your ongoing commitment to your health goals. Please review the following plan we discussed and let me know if I can assist you in the future.   Screening recommendations/referrals: Colonoscopy: referral 11/25/2020 Mammogram: referral 11/25/2020 Bone Density: referral 11/25/2020 Recommended yearly ophthalmology/optometry visit for glaucoma screening and checkup Recommended yearly dental visit for hygiene and checkup  Vaccinations: Influenza vaccine: due fall 2022  Pneumococcal vaccine: completed series Tdap vaccine: due up on injury  Shingles vaccine: pt declines   Advanced directives: will provide copies   Conditions/risks identified: none   Next appointment: 01/06/2021 @ 900am with Dr.Jordan    Preventive Care 16 Years and Older, Female Preventive care refers to lifestyle choices and visits with your health care provider that can promote health and wellness. What does preventive care include? A yearly physical exam. This is also called an annual well check. Dental exams once or twice a year. Routine eye exams. Ask your health care provider how often you should have your eyes checked. Personal lifestyle choices, including: Daily care of your teeth and gums. Regular physical activity. Eating a healthy diet. Avoiding tobacco and drug use. Limiting alcohol use. Practicing safe sex. Taking low-dose aspirin every day. Taking vitamin and mineral supplements as recommended by your health care provider. What happens during an annual well check? The services and screenings done by your health care provider during your annual well check will depend on your age, overall health, lifestyle risk factors, and family history of disease. Counseling  Your health care provider may ask you questions about your: Alcohol use. Tobacco use. Drug use. Emotional well-being. Home and relationship  well-being. Sexual activity. Eating habits. History of falls. Memory and ability to understand (cognition). Work and work Statistician. Reproductive health. Screening  You may have the following tests or measurements: Height, weight, and BMI. Blood pressure. Lipid and cholesterol levels. These may be checked every 5 years, or more frequently if you are over 27 years old. Skin check. Lung cancer screening. You may have this screening every year starting at age 3 if you have a 30-pack-year history of smoking and currently smoke or have quit within the past 15 years. Fecal occult blood test (FOBT) of the stool. You may have this test every year starting at age 55. Flexible sigmoidoscopy or colonoscopy. You may have a sigmoidoscopy every 5 years or a colonoscopy every 10 years starting at age 71. Hepatitis C blood test. Hepatitis B blood test. Sexually transmitted disease (STD) testing. Diabetes screening. This is done by checking your blood sugar (glucose) after you have not eaten for a while (fasting). You may have this done every 1-3 years. Bone density scan. This is done to screen for osteoporosis. You may have this done starting at age 12. Mammogram. This may be done every 1-2 years. Talk to your health care provider about how often you should have regular mammograms. Talk with your health care provider about your test results, treatment options, and if necessary, the need for more tests. Vaccines  Your health care provider may recommend certain vaccines, such as: Influenza vaccine. This is recommended every year. Tetanus, diphtheria, and acellular pertussis (Tdap, Td) vaccine. You may need a Td booster every 10 years. Zoster vaccine. You may need this after age 9. Pneumococcal 13-valent conjugate (PCV13) vaccine. One dose is recommended after age 40. Pneumococcal polysaccharide (PPSV23) vaccine. One dose is recommended after age 77. Talk to your  health care provider about which  screenings and vaccines you need and how often you need them. This information is not intended to replace advice given to you by your health care provider. Make sure you discuss any questions you have with your health care provider. Document Released: 07/01/2015 Document Revised: 02/22/2016 Document Reviewed: 04/05/2015 Elsevier Interactive Patient Education  2017 [employer] Prevention in the Home Falls can cause injuries. They can happen to people of all ages. There are many things you can do to make your home safe and to help prevent falls. What can I do on the outside of my home? Regularly fix the edges of walkways and driveways and fix any cracks. Remove anything that might make you trip as you walk through a door, such as a raised step or threshold. Trim any bushes or trees on the path to your home. Use bright outdoor lighting. Clear any walking paths of anything that might make someone trip, such as rocks or tools. Regularly check to see if handrails are loose or broken. Make sure that both sides of any steps have handrails. Any raised decks and porches should have guardrails on the edges. Have any leaves, snow, or ice cleared regularly. Use sand or salt on walking paths during winter. Clean up any spills in your garage right away. This includes oil or grease spills. What can I do in the bathroom? Use night lights. Install grab bars by the toilet and in the tub and shower. Do not use towel bars as grab bars. Use non-skid mats or decals in the tub or shower. If you need to sit down in the shower, use a plastic, non-slip stool. Keep the floor dry. Clean up any water that spills on the floor as soon as it happens. Remove soap buildup in the tub or shower regularly. Attach bath mats securely with double-sided non-slip rug tape. Do not have throw rugs and other things on the floor that can make you trip. What can I do in the bedroom? Use night lights. Make sure that you have a  light by your bed that is easy to reach. Do not use any sheets or blankets that are too big for your bed. They should not hang down onto the floor. Have a firm chair that has side arms. You can use this for support while you get dressed. Do not have throw rugs and other things on the floor that can make you trip. What can I do in the kitchen? Clean up any spills right away. Avoid walking on wet floors. Keep items that you use a lot in easy-to-reach places. If you need to reach something above you, use a strong step stool that has a grab bar. Keep electrical cords out of the way. Do not use floor polish or wax that makes floors slippery. If you must use wax, use non-skid floor wax. Do not have throw rugs and other things on the floor that can make you trip. What can I do with my stairs? Do not leave any items on the stairs. Make sure that there are handrails on both sides of the stairs and use them. Fix handrails that are broken or loose. Make sure that handrails are as long as the stairways. Check any carpeting to make sure that it is firmly attached to the stairs. Fix any carpet that is loose or worn. Avoid having throw rugs at the top or bottom of the stairs. If you do have throw rugs, attach them  to the floor with carpet tape. Make sure that you have a light switch at the top of the stairs and the bottom of the stairs. If you do not have them, ask someone to add them for you. What else can I do to help prevent falls? Wear shoes that: Do not have high heels. Have rubber bottoms. Are comfortable and fit you well. Are closed at the toe. Do not wear sandals. If you use a stepladder: Make sure that it is fully opened. Do not climb a closed stepladder. Make sure that both sides of the stepladder are locked into place. Ask someone to hold it for you, if possible. Clearly mark and make sure that you can see: Any grab bars or handrails. First and last steps. Where the edge of each step  is. Use tools that help you move around (mobility aids) if they are needed. These include: Canes. Walkers. Scooters. Crutches. Turn on the lights when you go into a dark area. Replace any light bulbs as soon as they burn out. Set up your furniture so you have a clear path. Avoid moving your furniture around. If any of your floors are uneven, fix them. If there are any pets around you, be aware of where they are. Review your medicines with your doctor. Some medicines can make you feel dizzy. This can increase your chance of falling. Ask your doctor what other things that you can do to help prevent falls. This information is not intended to replace advice given to you by your health care provider. Make sure you discuss any questions you have with your health care provider. Document Released: 03/31/2009 Document Revised: 11/10/2015 Document Reviewed: 07/09/2014 Elsevier Interactive Patient Education  2017 [employer].

## 2020-12-16 ENCOUNTER — Other Ambulatory Visit: Payer: Self-pay | Admitting: Family Medicine

## 2020-12-16 DIAGNOSIS — I10 Essential (primary) hypertension: Secondary | ICD-10-CM

## 2021-01-06 ENCOUNTER — Encounter: Payer: Self-pay | Admitting: Family Medicine

## 2021-01-06 ENCOUNTER — Other Ambulatory Visit: Payer: Self-pay

## 2021-01-06 ENCOUNTER — Ambulatory Visit (INDEPENDENT_AMBULATORY_CARE_PROVIDER_SITE_OTHER): Payer: Medicare Other | Admitting: Family Medicine

## 2021-01-06 VITALS — BP 130/80 | HR 97 | Temp 98.5°F | Resp 16 | Ht 61.0 in | Wt 200.4 lb

## 2021-01-06 DIAGNOSIS — Z1159 Encounter for screening for other viral diseases: Secondary | ICD-10-CM | POA: Diagnosis not present

## 2021-01-06 DIAGNOSIS — E785 Hyperlipidemia, unspecified: Secondary | ICD-10-CM

## 2021-01-06 DIAGNOSIS — M2550 Pain in unspecified joint: Secondary | ICD-10-CM

## 2021-01-06 DIAGNOSIS — I10 Essential (primary) hypertension: Secondary | ICD-10-CM | POA: Diagnosis not present

## 2021-01-06 DIAGNOSIS — Z6836 Body mass index (BMI) 36.0-36.9, adult: Secondary | ICD-10-CM

## 2021-01-06 LAB — LIPID PANEL
Cholesterol: 304 mg/dL — ABNORMAL HIGH (ref 0–200)
HDL: 67.4 mg/dL (ref >39.00)
LDL Cholesterol: 210 mg/dL — ABNORMAL HIGH (ref 0–99)
NonHDL: 236.29
Total CHOL/HDL Ratio: 5
Triglycerides: 129 mg/dL (ref 0.0–149.0)
VLDL: 25.8 mg/dL (ref 0.0–40.0)

## 2021-01-06 LAB — COMPREHENSIVE METABOLIC PANEL
ALT: 14 U/L (ref 0–35)
AST: 18 U/L (ref 0–37)
Albumin: 4.2 g/dL (ref 3.5–5.2)
Alkaline Phosphatase: 87 U/L (ref 39–117)
BUN: 13 mg/dL (ref 6–23)
CO2: 29 mEq/L (ref 19–32)
Calcium: 10 mg/dL (ref 8.4–10.5)
Chloride: 101 mEq/L (ref 96–112)
Creatinine, Ser: 1.07 mg/dL (ref 0.40–1.20)
GFR: 52.25 mL/min — ABNORMAL LOW (ref >60.00)
Glucose, Bld: 94 mg/dL (ref 70–99)
Potassium: 3.5 mEq/L (ref 3.5–5.1)
Sodium: 139 mEq/L (ref 135–145)
Total Bilirubin: 0.5 mg/dL (ref 0.2–1.2)
Total Protein: 7.4 g/dL (ref 6.0–8.3)

## 2021-01-06 NOTE — Progress Notes (Signed)
HPI: [name] is a 70 y.o. female, who is here today for chronic disease management.  Last seen on 08/29/20 No new problems since her last visit. Hypertension:  Medications:HCTZ 12.5 mg daily and Amlodipine-Valsartan 10-160 mg daily. BP readings at home:She is not checking BP at home, it causes stress. Side effects:None She was concerned about HCTZ and joint pain. Hx of gout, has not had an episode in years. Hand IP joint pain, improved with Tumeric tea. Negative for joint edema or erythema. + stiffness.  Negative for unusual or severe headache, visual changes, exertional chest pain, dyspnea,  focal weakness, or edema.  HypoK+: She is on OTC K+ supplementation.  Lab Results  Component Value Date   CREATININE 1.12 08/24/2020   BUN 19 08/24/2020   NA 140 08/24/2020   K 3.4 (L) 08/24/2020   CL 100 08/24/2020   CO2 30 08/24/2020   Hyperlipidemia: Currently on Non pharmacologic treatment. Following a low fat diet: Yes.. She is eating more vegetables and fruits.She also decreased meat intake. Walking 60-90 min daily. She has tried different cholesterol medication and has not tolerated well.  Lab Results  Component Value Date   CHOL 277 (H) 08/24/2020   HDL 67.70 08/24/2020   LDLCALC 189 (H) 08/24/2020   TRIG 103.0 08/24/2020   CHOLHDL 4 08/24/2020   Recovering from a URI, most symptoms resolved. Nasal congestion and rhinorrhea, she is using Flonase nasal spray at bedtime and eye drops in the morning.  Review of Systems  Constitutional: Negative for activity change, appetite change, fatigue and fever.  HENT: Negative for mouth sores and nosebleeds.   Eyes: Negative for pain and redness.  Respiratory: Negative for cough and wheezing.   Gastrointestinal: Negative for abdominal pain, nausea and vomiting.  Endocrine: Negative for cold intolerance and heat intolerance.  Genitourinary: Negative for decreased urine volume and hematuria.  Neurological:  Negative for syncope, facial asymmetry and speech difficulty.  Psychiatric/Behavioral: Negative for confusion. The patient is not nervous/anxious.   Rest of ROS see pertinent positives and negatives in HPI.  Current Outpatient Medications on File Prior to Visit  Medication Sig Dispense Refill   amLODipine-valsartan (EXFORGE) 10-160 MG tablet TAKE 1 TABLET BY MOUTH DAILY 90 tablet 0   Calcium-Magnesium (CAL-MAG PO) Take 1,000 mg by mouth every morning. Powder     fluticasone (FLONASE) 50 MCG/ACT nasal spray Place 1 spray into both nostrils daily. (Patient taking differently: Place 1 spray into both nostrils daily as needed.) 16 g 6   hydrochlorothiazide (MICROZIDE) 12.5 MG capsule Take 1 capsule by mouth once daily in the morning 90 capsule 2   IRON PO Take 65 mg by mouth every other day.      Magnesium 250 MG TABS Take 250 mg by mouth daily.     potassium gluconate 595 (99 K) MG TABS tablet Take 595 mg by mouth.     No current facility-administered medications on file prior to visit.    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   Cyst (solitary) of breast    L breast cyst, mamogram in September "unchanged"   Gout    Hyperlipidemia    Hypertension    Kidney cysts    Liver cyst    No Active Allergies  Social History   Socioeconomic History   Marital status: Divorced  Spouse name: Not on file   Number of children: Not on file   Years of education: Not on file   Highest education level: Not on file  Occupational History   Not on file  Tobacco Use   Smoking status: Never   Smokeless tobacco: Never  Vaping Use   Vaping Use: Never used  Substance and Sexual Activity   Alcohol use: No   Drug use: No   Sexual activity: Not on file  Other Topics Concern   Not on file  Social History Narrative   Not on file   Social Determinants of Health   Financial Resource Strain:  Low Risk    Difficulty of Paying Living Expenses: Not hard at all  Food Insecurity: No Food Insecurity   Worried About Running Out of Food in the Last Year: Never true   [place] in the Last Year: Never true  Transportation Needs: No Transportation Needs   Lack of Transportation (Medical): No   Lack of Transportation (Non-Medical): No  Physical Activity: Sufficiently Active   Days of Exercise per Week: 4 days   Minutes of Exercise per Session: 60 min  Stress: No Stress Concern Present   Feeling of Stress : Not at all  Social Connections: Socially Isolated   Frequency of Communication with Friends and Family: Twice a week   Frequency of Social Gatherings with Friends and Family: Twice a week   Attends Religious Services: Never   Marine scientist or Organizations: No   Attends Archivist Meetings: Never   Marital Status: Divorced   Vitals:   01/06/21 0857  BP: 130/80  Pulse: 97  Resp: 16  Temp: 98.5 F (36.9 C)  SpO2: 97%   Wt Readings from Last 3 Encounters:  01/06/21 200 lb 6 oz (90.9 kg)  04/13/20 194 lb (88 kg)  11/24/19 205 lb 6 oz (93.2 kg)   Body mass index is 37.86 kg/m.  Physical Exam  Nursing note and vitals reviewed. Constitutional: He is oriented to person, place, and time. He appears well-developed. No distress.  HENT:  Head: Normocephalic and atraumatic.  Mouth/Throat: Oropharynx is clear and moist. Mucous membranes are moist.Post nasal drainage.  Eyes: Pupils are equal, round, and reactive to light. Conjunctivae are normal.  Cardiovascular: Normal rate and regular rhythm.  No murmur heard. Pulses:      Dorsalis pedis pulses are 2+ on the right side and 2+ on the left side.  Respiratory: Effort normal and breath sounds normal. No respiratory distress.  GI: Soft. He exhibits no mass. There is no hepatomegaly. There is no abdominal tenderness.  Musculoskeletal:        General: No edema.  Lymphadenopathy:    He has no cervical  adenopathy.  Neurological: He is alert and oriented to person, place, and time. He has normal strength. No cranial nerve deficit. Gait normal.  Skin: Skin is warm. No rash noted. No erythema.  Psychiatric: He has a normal mood and affect.  Well groomed, good eye contact.   ASSESSMENT AND PLAN:  Kristen Wolf was seen today for follow-up.  Diagnoses and all orders for this visit: Orders Placed This Encounter  Procedures   Comprehensive metabolic panel   Lipid panel   Hepatitis C antibody   Lab Results  Component Value Date   CHOL 304 (H) 01/06/2021   HDL 67.40 01/06/2021   LDLCALC 210 (H) 01/06/2021   TRIG 129.0 01/06/2021   CHOLHDL 5 01/06/2021   Lab Results  Component Value Date   CREATININE 1.07 01/06/2021   BUN 13 01/06/2021   NA 139 01/06/2021   K 3.5 01/06/2021   CL 101 01/06/2021   CO2 29 01/06/2021   Lab Results  Component Value Date   ALT 14 01/06/2021   AST 18 01/06/2021   ALKPHOS 87 01/06/2021   BILITOT 0.5 01/06/2021   Hyperlipidemia, unspecified hyperlipidemia type Continue non pharmacologic treatment. Further recommendations will be given according to 10 years CVD risk score and lipid panel results.  Essential hypertension, benign BP adequately controlled. Continue current management: HCTZ 12.5 mg and Amlodipine-Valsartan 10-160 mg daily. DASH/low salt diet to continue. Monitor BP at home. Eye exam current.  Encounter for HCV screening test for low risk patient -     Hepatitis C antibody  Class 2 severe obesity with serious comorbidity and body mass index (BMI) of 36.0 to 36.9 in adult, unspecified obesity type ([place]) She understands the benefits of wt loss as well as adverse effects of obesity. Consistency with healthy diet and physical activity encouraged.  Polyarthralgia Most likely OA. Continue tumeric supplementation.  Return in about 6 months (around 07/09/2021).  Jadzia Ibsen G. Martinique, MD  [facility]. [place]  office.

## 2021-01-06 NOTE — Patient Instructions (Signed)
A few things to remember from today's visit:  Hyperlipidemia, unspecified hyperlipidemia type - Plan: Comprehensive metabolic panel, Lipid panel  Essential hypertension, benign - Plan: Comprehensive metabolic panel  If you need refills please call your pharmacy. Do not use My Chart to request refills or for acute issues that need immediate attention.   No changes today. Continue regular physical activities and a healthful diet. Recommendations in regard to cholesterol according to lab results.  It D [id number] daily.  Please be sure medication list is accurate. If a new problem present, please set up appointment sooner than planned today.

## 2021-01-09 ENCOUNTER — Other Ambulatory Visit: Payer: Self-pay

## 2021-01-09 LAB — HEPATITIS C ANTIBODY
Hepatitis C Ab: NONREACTIVE
SIGNAL TO CUT-OFF: 0.01 (ref <1.00)

## 2021-01-09 MED ORDER — ROSUVASTATIN CALCIUM 10 MG PO TABS: ORAL_TABLET | ORAL | 3 refills | Status: DC

## 2021-01-18 DIAGNOSIS — Z23 Encounter for immunization: Secondary | ICD-10-CM | POA: Diagnosis not present

## 2021-03-14 ENCOUNTER — Other Ambulatory Visit: Payer: Self-pay | Admitting: Family Medicine

## 2021-03-14 DIAGNOSIS — I10 Essential (primary) hypertension: Secondary | ICD-10-CM

## 2021-03-20 DIAGNOSIS — Z23 Encounter for immunization: Secondary | ICD-10-CM | POA: Diagnosis not present

## 2021-03-24 ENCOUNTER — Other Ambulatory Visit: Payer: Self-pay | Admitting: Family Medicine

## 2021-03-24 DIAGNOSIS — I10 Essential (primary) hypertension: Secondary | ICD-10-CM

## 2021-04-26 ENCOUNTER — Telehealth: Payer: Self-pay | Admitting: Pharmacist

## 2021-04-26 NOTE — Chronic Care Management (AMB) (Signed)
Chronic Care Management Pharmacy Assistant   Name: Kristen Wolf  MRN: [id number] DOB: [birth year]  Reason for Encounter: General Assessment Call    Conditions to be addressed/monitored: HTN & HLD  Recent office visits:  01/06/21 Martinique, Betty G, MD - Patient presented for HLD unspecified and other concerns.   Recent consult visits:  None  Hospital visits:  None in previous 6 months  Medications: Outpatient Encounter Medications as of 04/26/2021  Medication Sig   amLODipine-valsartan (EXFORGE) 10-160 MG tablet TAKE 1 TABLET BY MOUTH EVERY DAY   Calcium-Magnesium (CAL-MAG PO) Take 1,000 mg by mouth every morning. Powder   fluticasone (FLONASE) 50 MCG/ACT nasal spray Place 1 spray into both nostrils daily. (Patient taking differently: Place 1 spray into both nostrils daily as needed.)   hydrochlorothiazide (MICROZIDE) 12.5 MG capsule TAKE 1 CAPSULE BY MOUTH ONCE DAILY IN THE MORNING   IRON PO Take 65 mg by mouth every other day.    Magnesium 250 MG TABS Take 250 mg by mouth daily.   potassium gluconate 595 (99 K) MG TABS tablet Take 595 mg by mouth.   rosuvastatin (CRESTOR) 10 MG tablet Take 1 tablet by mouth 3 times per week.   No facility-administered encounter medications on file as of 04/26/2021.  Reviewed chart prior to disease state call. Spoke with patient regarding BP  Recent Office Vitals: BP Readings from Last 3 Encounters:  01/06/21 130/80  04/13/20 122/80  11/24/19 120/72   Pulse Readings from Last 3 Encounters:  01/06/21 97  04/13/20 100  11/24/19 88    Wt Readings from Last 3 Encounters:  01/06/21 200 lb 6 oz (90.9 kg)  04/13/20 194 lb (88 kg)  11/24/19 205 lb 6 oz (93.2 kg)     Kidney Function Lab Results  Component Value Date/Time   CREATININE 1.07 01/06/2021 09:52 AM   CREATININE 1.12 08/24/2020 08:04 AM   CREATININE 1.15 (H) 01/12/2020 08:47 AM   GFR 52.25 (L) 01/06/2021 09:52 AM   GFRNONAA 48 (L) 01/05/2017 04:04 AM   GFRAA 55 (L)  01/05/2017 04:04 AM    BMP Latest Ref Rng & Units 01/06/2021 08/24/2020 01/12/2020  Glucose 70 - 99 mg/dL 94 87 101(H)  BUN 6 - 23 mg/dL 13 19 17   Creatinine 0.40 - 1.20 mg/dL 1.07 1.12 1.15(H)  BUN/Creat Ratio 6 - 22 (calc) - - 15  Sodium 135 - 145 mEq/L 139 140 139  Potassium 3.5 - 5.1 mEq/L 3.5 3.4(L) 3.6  Chloride 96 - 112 mEq/L 101 100 101  CO2 19 - 32 mEq/L 29 30 28   Calcium 8.4 - 10.5 mg/dL 10.0 10.0 9.4  04/26/2021 Name: Kristen Wolf MRN: [id number] DOB: [birth year] Kristen Wolf is a 70 y.o. year old female who is a primary care patient of Martinique, Malka So, MD.  Comprehensive medication review performed; Spoke to patient regarding cholesterol  Lipid Panel    Component Value Date/Time   CHOL 304 (H) 01/06/2021 0952   TRIG 129.0 01/06/2021 0952   HDL 67.40 01/06/2021 0952   LDLCALC 210 (H) 01/06/2021 0952    10-year ASCVD risk score: The 10-year ASCVD risk score (Arnett DK, et al., 2019) is: 19.1%   Values used to calculate the score:     Age: 65 years     Sex: Female     Is Non-Hispanic African American: Yes     Diabetic: No     Tobacco smoker: No     Systolic Blood Pressure: 453 mmHg  Is BP treated: Yes     HDL Cholesterol: 67.4 mg/dL     Total Cholesterol: 304 mg/dL  Current antihyperlipidemic regimen:  Rosuvastatin (Crestor) 10 mg 3 times a week Previous antihyperlipidemic medications tried: None  ASCVD risk enhancing conditions: age >89 and HTN What recent interventions/DTPs have been made by any provider to improve Cholesterol control since last CPP Visit: No changes Any recent hospitalizations or ED visits since last visit with CPP? No What diet changes have been made to improve Cholesterol?  Patient reports she has been watching her intake of high cholesterol foods  Adherence Review: Does the patient have >5 day gap between last estimated fill dates? No    Current antihypertensive regimen:  Amlodipine-valsartan 10-160 mg 1 tablet  daily Hydrochlorothiazide 12.5 mg 1 capsule daily in the morning How often are you checking your Blood Pressure? Does not check Current home BP readings:  Patient reports she still does not check her pressures at home it gives her stress doing so. She reports no headaches or dizziness that would indicate high or low blood pressure readings. Was 130/80 in office in July What recent interventions/DTPs have been made by any provider to improve Blood Pressure control since last CPP Visit: Patient reports no changes Any recent hospitalizations or ED visits since last visit with CPP? No What diet changes have been made to improve Blood Pressure Control?  Patient reports she watches her sodium intake and is still eating more vegetables with her meals What exercise is being done to improve your Blood Pressure Control?    Patient reports she is still doing her walking as much as possible during the days.  Adherence Review: Is the patient currently on ACE/ARB medication? Yes Does the patient have >5 day gap between last estimated fill dates? No   Notes: Patient reports she will schedule her mammogram when she goes to get her bone density scan in December, advised her it has been ordered. She reports she has been doing generally well been keeping up with watching her diet and her walking for physical activity. Advised her of her upcoming appointments and she was in agreement.  Care Gaps: BP- 130/80 (7/22 office) CCM- 2/23 AWV- 6/22 TDAP - Overdue COVID Booster (#5 [place]) - Overdue Mammogram - Patient advised she will schedule at her DEXA in Dec  Star Rating Drugs: Amlodipine-Valsartan 10-160 mg - Last filled 03/14/21 90 [facility] Rosuvastatin (Crestor) 10 mg- Last filled 03/18/21 70 DS at [facility] Pharmacist Assistant [phone]

## 2021-05-19 ENCOUNTER — Other Ambulatory Visit: Payer: Self-pay | Admitting: Family Medicine

## 2021-05-19 DIAGNOSIS — M810 Age-related osteoporosis without current pathological fracture: Secondary | ICD-10-CM

## 2021-05-24 ENCOUNTER — Ambulatory Visit
Admission: RE | Admit: 2021-05-24 | Discharge: 2021-05-24 | Disposition: A | Discharge status: Home / Self Care | Payer: Medicare Other | Source: Ambulatory Visit | Attending: Family Medicine | Admitting: Family Medicine

## 2021-05-24 DIAGNOSIS — M810 Age-related osteoporosis without current pathological fracture: Secondary | ICD-10-CM

## 2021-05-24 DIAGNOSIS — M85852 Other specified disorders of bone density and structure, left thigh: Secondary | ICD-10-CM | POA: Diagnosis not present

## 2021-05-29 ENCOUNTER — Encounter: Payer: Self-pay | Admitting: Family Medicine

## 2021-05-29 DIAGNOSIS — M8580 Other specified disorders of bone density and structure, unspecified site: Secondary | ICD-10-CM | POA: Insufficient documentation

## 2021-06-05 DIAGNOSIS — Z23 Encounter for immunization: Secondary | ICD-10-CM | POA: Diagnosis not present

## 2021-07-07 NOTE — Progress Notes (Signed)
HPI: [name] is a 71 y.o. female, who is here today for chronic disease management.  Last seen on 01/06/21  Hypertension:  Medications: Amlodipine-valsartan 10-160 mg daily and hydrochlorothiazide 12.5 mg daily. BP readings at home:Not checking it regularly. Side effects:none  Negative for unusual or severe headache, visual changes, exertional chest pain, dyspnea,  focal weakness, or edema. LE edema at the end of the day, worse depending of level of activity. Lab Results  Component Value Date   CREATININE 1.07 01/06/2021   BUN 13 01/06/2021   NA 139 01/06/2021   K 3.5 01/06/2021   CL 101 01/06/2021   CO2 29 01/06/2021   Hyperlipidemia: Currently on Crestor 10 mg 2-3 times per week. + Arthralgias if she takes it more often. Following a low fat diet: Decreased [place] intake. She has not tolerated other statins in the past.  Lab Results  Component Value Date   CHOL 304 (H) 01/06/2021   HDL 67.40 01/06/2021   LDLCALC 210 (H) 01/06/2021   TRIG 129.0 01/06/2021   CHOLHDL 5 01/06/2021   She goes for a walk depending of the weather. Arthralgias, she thinks it may bee uric acid elevation. It seems to be worse with certain food. Negative for joint edema or erythema.  Review of Systems  Constitutional:  Negative for activity change, appetite change and fever.  HENT:  Negative for mouth sores, nosebleeds and sore throat.   Respiratory:  Negative for cough and wheezing.   Gastrointestinal:  Negative for abdominal pain, nausea and vomiting.       Negative for changes in bowel habits.  Genitourinary:  Negative for decreased urine volume and hematuria.  Musculoskeletal:  Positive for arthralgias.  Neurological:  Negative for syncope and facial asymmetry.  Rest of ROS see pertinent positives and negatives in HPI.  Current Outpatient Medications on File Prior to Visit  Medication Sig Dispense Refill   Calcium-Magnesium (CAL-MAG PO) Take 1,000 mg by mouth every  morning. Powder     fluticasone (FLONASE) 50 MCG/ACT nasal spray Place 1 spray into both nostrils daily. (Patient taking differently: Place 1 spray into both nostrils daily as needed.) 16 g 6   hydrochlorothiazide (MICROZIDE) 12.5 MG capsule TAKE 1 CAPSULE BY MOUTH ONCE DAILY IN THE MORNING 90 capsule 2   IRON PO Take 65 mg by mouth every other day.      Magnesium 250 MG TABS Take 250 mg by mouth daily.     rosuvastatin (CRESTOR) 10 MG tablet Take 1 tablet by mouth 3 times per week. 30 tablet 3   No current facility-administered medications on file prior to visit.   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   Cyst (solitary) of breast    L breast cyst, mamogram in September "unchanged"   Gout    Hyperlipidemia    Hypertension    Kidney cysts    Liver cyst    No Known Allergies  Social History   Socioeconomic History   Marital status: Divorced    Spouse name: Not on file   Number of children: Not on file   Years of education: Not on file   Highest education level: Not on file  Occupational History   Not on file  Tobacco Use   Smoking status: Never   Smokeless tobacco: Never  Vaping Use  Vaping Use: Never used  Substance and Sexual Activity   Alcohol use: No   Drug use: No   Sexual activity: Not on file  Other Topics Concern   Not on file  Social History Narrative   Not on file   Social Determinants of Health   Financial Resource Strain: Not on file  Food Insecurity: No Food Insecurity   Worried About Running Out of Food in the Last Year: Never true   Ran Out of Food in the Last Year: Never true  Transportation Needs: Not on file  Physical Activity: Sufficiently Active   Days of Exercise per Week: 4 days   Minutes of Exercise per Session: 60 min  Stress: No Stress Concern Present   Feeling of Stress : Not at all  Social Connections: Socially Isolated    Frequency of Communication with Friends and Family: Twice a week   Frequency of Social Gatherings with Friends and Family: Twice a week   Attends Religious Services: Never   Marine scientist or Organizations: No   Attends Archivist Meetings: Never   Marital Status: Divorced   Vitals:   07/10/21 0753  BP: 130/84  Pulse: 88  Resp: 16  SpO2: 97%   Wt Readings from Last 3 Encounters:  07/10/21 198 lb 4 oz (89.9 kg)  01/06/21 200 lb 6 oz (90.9 kg)  04/13/20 194 lb (88 kg)   Body mass index is 37.46 kg/m.  Physical Exam Vitals and nursing note reviewed.  Constitutional:      General: She is not in acute distress.    Appearance: She is well-developed.  HENT:     Head: Normocephalic and atraumatic.     Mouth/Throat:     Mouth: Mucous membranes are moist.     Pharynx: Oropharynx is clear.  Eyes:     Conjunctiva/sclera: Conjunctivae normal.  Cardiovascular:     Rate and Rhythm: Normal rate and regular rhythm.     Pulses:          Dorsalis pedis pulses are 2+ on the right side and 2+ on the left side.     Heart sounds: No murmur heard. Pulmonary:     Effort: Pulmonary effort is normal. No respiratory distress.     Breath sounds: Normal breath sounds.  Abdominal:     Palpations: Abdomen is soft. There is no hepatomegaly or mass.     Tenderness: There is no abdominal tenderness.  Musculoskeletal:     Comments: No signs of synovitis.  Lymphadenopathy:     Cervical: No cervical adenopathy.  Skin:    General: Skin is warm.     Findings: No erythema or rash.  Neurological:     General: No focal deficit present.     Mental Status: She is alert and oriented to person, place, and time.     Cranial Nerves: No cranial nerve deficit.     Gait: Gait normal.  Psychiatric:     Comments: Well groomed, good eye contact.   ASSESSMENT AND PLAN:  [name] was seen today for follow-up.  Diagnoses and all orders for this visit:  Orders Placed This Encounter   Procedures   Basic metabolic panel   Uric acid   Lipid panel   Potassium   Lab Results  Component Value Date   CREATININE 1.10 07/10/2021   BUN 16 07/10/2021   NA 137 07/10/2021   K 3.2 (L) 07/10/2021   CL 98 07/10/2021   CO2 30 07/10/2021  Lab Results  Component Value Date   CHOL 234 (H) 07/10/2021   HDL 69.80 07/10/2021   LDLCALC 140 (H) 07/10/2021   TRIG 121.0 07/10/2021   CHOLHDL 3 07/10/2021   Lab Results  Component Value Date   LABURIC 6.0 07/10/2021   Essential hypertension, benign BP adequately controlled. Continue Amlodipine-valsartan 10-160 mg daily and hydrochlorothiazide 12.5 mg daily. Low salt/DASH diet recommended. Eye exam current.  Class 1 obesity with body mass index (BMI) of 33.0 to 33.9 in adult She has lost some weight since her last visit. We discussed benefits of wt loss as well as adverse effects of obesity. Consistency with healthy diet and physical activity encouraged.   Hyperlipidemia We discussed CV benefits of statins. Continue Crestor 10 mg 2-3 times per week and low fat diet. Further recommendations according to FLP result.   Polyarthralgia Most likely OA. Differential diagnosis discussed. It seems to be worse with certain foods, uric acid ordered today. Further recommendation will be given according to lab results.  CKD (chronic kidney disease), stage III ([place]) Problem has been stable. Continue adequate hydration, BP control, low-salt diet, and avoidance of NSAIDs.  Hypokalemia K+ 3.2. Will recommend K-Lor 20 mEq daily. Discontinue OTC potassium supplementation. Potassium to be repeated in 3 to 4 weeks.  Return in about 6 months (around 01/07/2022).  Symphonie Schneiderman G. [place], MD  [facility]. [place] office.

## 2021-07-10 ENCOUNTER — Ambulatory Visit (INDEPENDENT_AMBULATORY_CARE_PROVIDER_SITE_OTHER): Payer: Medicare Other | Admitting: Family Medicine

## 2021-07-10 ENCOUNTER — Encounter: Payer: Self-pay | Admitting: Family Medicine

## 2021-07-10 VITALS — BP 130/84 | HR 88 | Resp 16 | Ht 61.0 in | Wt 198.2 lb

## 2021-07-10 DIAGNOSIS — Z6833 Body mass index (BMI) 33.0-33.9, adult: Secondary | ICD-10-CM | POA: Diagnosis not present

## 2021-07-10 DIAGNOSIS — E669 Obesity, unspecified: Secondary | ICD-10-CM

## 2021-07-10 DIAGNOSIS — E785 Hyperlipidemia, unspecified: Secondary | ICD-10-CM

## 2021-07-10 DIAGNOSIS — M2550 Pain in unspecified joint: Secondary | ICD-10-CM | POA: Diagnosis not present

## 2021-07-10 DIAGNOSIS — N183 Chronic kidney disease, stage 3 unspecified: Secondary | ICD-10-CM | POA: Insufficient documentation

## 2021-07-10 DIAGNOSIS — E876 Hypokalemia: Secondary | ICD-10-CM | POA: Diagnosis not present

## 2021-07-10 DIAGNOSIS — N1831 Chronic kidney disease, stage 3a: Secondary | ICD-10-CM

## 2021-07-10 DIAGNOSIS — I10 Essential (primary) hypertension: Secondary | ICD-10-CM

## 2021-07-10 LAB — BASIC METABOLIC PANEL
BUN: 16 mg/dL (ref 6–23)
CO2: 30 mEq/L (ref 19–32)
Calcium: 10.3 mg/dL (ref 8.4–10.5)
Chloride: 98 mEq/L (ref 96–112)
Creatinine, Ser: 1.1 mg/dL (ref 0.40–1.20)
GFR: 50.37 mL/min — ABNORMAL LOW (ref >60.00)
Glucose, Bld: 107 mg/dL — ABNORMAL HIGH (ref 70–99)
Potassium: 3.2 mEq/L — ABNORMAL LOW (ref 3.5–5.1)
Sodium: 137 mEq/L (ref 135–145)

## 2021-07-10 LAB — LIPID PANEL
Cholesterol: 234 mg/dL — ABNORMAL HIGH (ref 0–200)
HDL: 69.8 mg/dL (ref >39.00)
LDL Cholesterol: 140 mg/dL — ABNORMAL HIGH (ref 0–99)
NonHDL: 164.19
Total CHOL/HDL Ratio: 3
Triglycerides: 121 mg/dL (ref 0.0–149.0)
VLDL: 24.2 mg/dL (ref 0.0–40.0)

## 2021-07-10 LAB — URIC ACID: Uric Acid, Serum: 6 mg/dL (ref 2.4–7.0)

## 2021-07-10 MED ORDER — AMLODIPINE BESYLATE-VALSARTAN 10-160 MG PO TABS: 1.0000 | ORAL_TABLET | Freq: Every day | ORAL | 2 refills | Status: DC

## 2021-07-10 MED ORDER — POTASSIUM CHLORIDE CRYS ER 20 MEQ PO TBCR: 20.0000 meq | EXTENDED_RELEASE_TABLET | Freq: Every day | ORAL | 0 refills | Status: DC

## 2021-07-10 NOTE — Assessment & Plan Note (Addendum)
She has lost some weight since her last visit. We discussed benefits of wt loss as well as adverse effects of obesity. Consistency with healthy diet and physical activity encouraged.

## 2021-07-10 NOTE — Patient Instructions (Signed)
A few things to remember from today's visit:  Essential hypertension, benign - Plan: Basic metabolic panel  Hyperlipidemia, unspecified hyperlipidemia type - Plan: Lipid panel  Polyarthralgia - Plan: Uric acid  If you need refills please call your pharmacy. Do not use My Chart to request refills or for acute issues that need immediate attention.   No changes today.  Please be sure medication list is accurate. If a new problem present, please set up appointment sooner than planned today.

## 2021-07-10 NOTE — Assessment & Plan Note (Signed)
BP adequately controlled. Continue Amlodipine-valsartan 10-160 mg daily and hydrochlorothiazide 12.5 mg daily. Low salt/DASH diet recommended. Eye exam current.

## 2021-07-10 NOTE — Assessment & Plan Note (Signed)
Problem has been stable. Continue adequate hydration, BP control, low-salt diet, and avoidance of NSAIDs.

## 2021-07-10 NOTE — Assessment & Plan Note (Signed)
We discussed CV benefits of statins. Continue Crestor 10 mg 2-3 times per week and low fat diet. Further recommendations according to FLP result.

## 2021-07-10 NOTE — Assessment & Plan Note (Signed)
Most likely OA. Differential diagnosis discussed. It seems to be worse with certain foods, uric acid ordered today. Further recommendation will be given according to lab results.

## 2021-07-18 ENCOUNTER — Telehealth: Payer: Self-pay | Admitting: Pharmacist

## 2021-07-18 NOTE — Chronic Care Management (AMB) (Signed)
° ° °  Chronic Care Management Pharmacy Assistant   Name: Kristen Wolf  MRN: [id number] DOB: 1950/08/26 07/18/21 APPOINTMENT REMINDER    Patient was reminded to have all medications, supplements and any blood glucose and blood pressure readings available for review with [name], Pharm. D, for telephone visit on 07/19/21 at 1.   Care Gaps: BP- 130/84 (07/10/21) CCM- 2/23 AWV- 6/22 Mammogram - Patient advised she will schedule at her DEXA in Dec  Star Rating Drug: Amlodipine-Valsartan 10-160 mg - [facility] Rosuvastatin (Crestor) 10 mg- Last filled [facility]/26/21 [facility]  Any gaps in medications fill history?  None     Medications: Outpatient Encounter Medications as of 07/18/2021  Medication Sig   amLODipine-valsartan (EXFORGE) 10-160 MG tablet Take 1 tablet by mouth daily.   Calcium-Magnesium (CAL-MAG PO) Take 1,000 mg by mouth every morning. Powder   fluticasone (FLONASE) 50 MCG/ACT nasal spray Place 1 spray into both nostrils daily. (Patient taking differently: Place 1 spray into both nostrils daily as needed.)   hydrochlorothiazide (MICROZIDE) 12.5 MG capsule TAKE 1 CAPSULE BY MOUTH ONCE DAILY IN THE MORNING   IRON PO Take 65 mg by mouth every other day.    Magnesium 250 MG TABS Take 250 mg by mouth daily.   potassium chloride SA (KLOR-CON M) 20 MEQ tablet Take 1 tablet (20 mEq total) by mouth daily.   rosuvastatin (CRESTOR) 10 MG tablet Take 1 tablet by mouth 3 times per week.   No facility-administered encounter medications on file as of 07/18/2021.     [place] Clinical Pharmacist Assistant [phone]

## 2021-07-19 ENCOUNTER — Ambulatory Visit (INDEPENDENT_AMBULATORY_CARE_PROVIDER_SITE_OTHER): Payer: Medicare Other | Admitting: Pharmacist

## 2021-07-19 DIAGNOSIS — E785 Hyperlipidemia, unspecified: Secondary | ICD-10-CM

## 2021-07-19 DIAGNOSIS — I10 Essential (primary) hypertension: Secondary | ICD-10-CM

## 2021-07-19 NOTE — Progress Notes (Signed)
Chronic Care Management Pharmacy Note  07/20/2021 Name:  Kristen Wolf MRN:  [id number] DOB:  10-19-1950  Summary: BP is at goal < 130/80 LDL is not at goal < 100  Recommendations/Changes made from today's visit: -Recommended increasing rosuvastatin to 4 times a week per PCP. Patient reported she will increase to 3 times a week more consistently instead of twice. -Recommended for patient to stop the calcium with vitamin D once she finishes it up and then take cal-mag-vit D supplement twice daily. -Will inquire about follow up with PCP and repeat lipid panel  Plan: Scheduled repeat lab work for potassium level in 2 weeks HLD assessment in 2 months   Subjective: Kristen Wolf is an 71 y.o. year old female who is a primary patient of Martinique, Malka So, MD.  The CCM team was consulted for assistance with disease management and care coordination needs.    Engaged with patient by telephone for follow up visit in response to provider referral for pharmacy case management and/or care coordination services.   Consent to Services:  The patient was given information about Chronic Care Management services, agreed to services, and gave verbal consent prior to initiation of services.  Please see initial visit note for detailed documentation.   Patient Care Team: Martinique, Betty G, MD as PCP - General (Family Medicine) Viona Gilmore, [facility] as Pharmacist (Pharmacist)  Recent office visits: 07/10/21 Betty Martinique, MD: Patient presented for HLD and HTN follow up. Recommended increasing rosuvastatin to 4 times a week. Prescribed potassium chloride.  Recent consult visits: None  Hospital visits: None in previous 6 months   Objective:  Lab Results  Component Value Date   CREATININE 1.10 07/10/2021   BUN 16 07/10/2021   GFR 50.37 (L) 07/10/2021   GFRNONAA 48 (L) 01/05/2017   GFRAA 55 (L) 01/05/2017   NA 137 07/10/2021   K 3.2 (L) 07/10/2021   CALCIUM 10.3 07/10/2021   CO2 30  07/10/2021   GLUCOSE 107 (H) 07/10/2021    Lab Results  Component Value Date/Time   GFR 50.37 (L) 07/10/2021 08:34 AM   GFR 52.25 (L) 01/06/2021 09:52 AM    Last diabetic Eye exam: No results found for: HMDIABEYEEXA  Last diabetic Foot exam: No results found for: HMDIABFOOTEX   Lab Results  Component Value Date   CHOL 234 (H) 07/10/2021   HDL 69.80 07/10/2021   LDLCALC 140 (H) 07/10/2021   TRIG 121.0 07/10/2021   CHOLHDL 3 07/10/2021    Hepatic Function Latest Ref Rng & Units 01/06/2021  Total Protein 6.0 - 8.3 g/dL 7.4  Albumin 3.5 - 5.2 g/dL 4.2  AST 0 - 37 U/L 18  ALT 0 - 35 U/L 14  Alk Phosphatase 39 - 117 U/L 87  Total Bilirubin 0.2 - 1.2 mg/dL 0.5    Lab Results  Component Value Date/Time   TSH 1.60 01/28/2017 05:39 PM    CBC Latest Ref Rng & Units 01/05/2017 04/28/2011 04/28/2011  WBC 4.0 - 10.5 K/uL 7.9 - 8.2  Hemoglobin 12.0 - 15.0 g/dL 14.3 15.3(H) 14.1  Hematocrit 36.0 - 46.0 % 42.5 45.0 42.2  Platelets 150 - 400 K/uL 288 - 272    Lab Results  Component Value Date/Time   VD25OH 96.92 12/08/2019 08:12 AM    Clinical ASCVD: No  The 10-year ASCVD risk score (Arnett DK, et al., 2019) is: 16.3%   Values used to calculate the score:     Age: 71 years  Sex: Female     Is Non-Hispanic African American: Yes     Diabetic: No     Tobacco smoker: No     Systolic Blood Pressure: 638 mmHg     Is BP treated: Yes     HDL Cholesterol: 69.8 mg/dL     Total Cholesterol: 234 mg/dL    Depression screen [facility] 2/9 07/10/2021 11/25/2020 11/25/2020  Decreased Interest 0 0 0  Down, Depressed, Hopeless 0 0 0  PHQ - 2 Score 0 0 0      Social History   Tobacco Use  Smoking Status Never  Smokeless Tobacco Never   BP Readings from Last 3 Encounters:  07/10/21 130/84  01/06/21 130/80  04/13/20 122/80   Pulse Readings from Last 3 Encounters:  07/10/21 88  01/06/21 97  04/13/20 100   Wt Readings from Last 3 Encounters:  07/10/21 198 lb 4 oz (89.9 kg)   01/06/21 200 lb 6 oz (90.9 kg)  04/13/20 194 lb (88 kg)   BMI Readings from Last 3 Encounters:  07/10/21 37.46 kg/m  01/06/21 37.86 kg/m  08/29/20 36.66 kg/m    Assessment/Interventions: Review of patient past medical history, allergies, medications, health status, including review of consultants reports, laboratory and other test data, was performed as part of comprehensive evaluation and provision of chronic care management services.   SDOH:  (Social Determinants of Health) assessments and interventions performed: No  SDOH Screenings   Alcohol Screen: Low Risk    Last Alcohol Screening Score (AUDIT): 0  Depression (PHQ2-9): Low Risk    PHQ-2 Score: 0  Financial Resource Strain: Not on file  Food Insecurity: No Food Insecurity   Worried About Charity fundraiser in the Last Year: Never true   Ran Out of Food in the Last Year: Never true  Housing: [place] Risk Score: 0  Physical Activity: Sufficiently Active   Days of Exercise per Week: 4 days   Minutes of Exercise per Session: 60 min  Social Connections: Socially Isolated   Frequency of Communication with Friends and Family: Twice a week   Frequency of Social Gatherings with Friends and Family: Twice a week   Attends Religious Services: Never   Marine scientist or Organizations: No   Attends Music therapist: Never   Marital Status: Divorced  Stress: No Stress Concern Present   Feeling of Stress : Not at all  Tobacco Use: Low Risk    Smoking Tobacco Use: Never   Smokeless Tobacco Use: Never   Passive Exposure: Not on file  Transportation Needs: Not on file    [place]  No Known Allergies  Medications Reviewed Today     Reviewed by Viona Gilmore, [place] (Pharmacist) on 07/19/21 at [place] List Status: <None>   Medication Order Taking? Sig Documenting Provider Last Dose Status Informant  amLODipine-valsartan (EXFORGE) 10-160 MG tablet [id number] Yes Take 1 tablet by  mouth daily. Martinique, Betty G, MD Taking Active   Calcium-Magnesium (CAL-MAG PO) [id number] Yes Take 500 mg by mouth every morning. Powder [provider] Taking Active   fluticasone (FLONASE) 50 MCG/ACT nasal spray [id number]  Place 1 spray into both nostrils daily.  Patient taking differently: Place 1 spray into both nostrils daily as needed.   Martinique, Betty G, MD  Active   hydrochlorothiazide (MICROZIDE) 12.5 MG capsule [id number] Yes TAKE 1 CAPSULE BY MOUTH ONCE DAILY IN THE MORNING Martinique, Betty G, MD Taking Active  IRON PO [id number]  Take 65 mg by mouth every other day.  [provider]  Active   potassium chloride SA (KLOR-CON M) 20 MEQ tablet [id number]  Take 1 tablet (20 mEq total) by mouth daily. Martinique, Betty G, MD  Active   rosuvastatin (CRESTOR) 10 MG tablet [id number]  Take 1 tablet by mouth 3 times per week. Martinique, Betty G, MD  Active             Patient Active Problem List   Diagnosis Date Noted   Polyarthralgia 07/10/2021   CKD (chronic kidney disease), stage III ([place]) 07/10/2021   Osteopenia 05/29/2021   Hypertensive retinopathy of both eyes 05/26/2019   Hyperlipidemia 02/19/2018   Essential hypertension, benign 01/28/2017   Allergic rhinitis 01/28/2017   Class 1 obesity with body mass index (BMI) of 33.0 to 33.9 in adult 01/28/2017    Immunization History  Administered Date(s) Administered   Fluad Quad(high Dose 65+) 02/29/2020   Influenza, High Dose Seasonal PF 04/22/2018, 04/14/2019, 03/20/2021   PFIZER Comirnaty(Gray Top)Covid-19 Tri-Sucrose Vaccine 01/18/2021   PFIZER(Purple Top)SARS-COV-2 Vaccination 08/09/2019, 09/02/2019, 04/07/2020, 06/05/2021   PNEUMOCOCCAL CONJUGATE-20 09/21/2020   Pfizer Covid-19 Vaccine Bivalent Booster 54yr & up 06/05/2021   Pneumococcal Conjugate-13 08/16/2014   Pneumococcal Polysaccharide-23 05/01/2017   Tdap 01/15/2007    Conditions to be addressed/monitored:  Hypertension, Hyperlipidemia, Osteopenia, and  Allergic Rhinitis  Conditions addressed this visit: Hypertension, hyperlipidemia, osteopenia  Care Plan : CCM Pharmacy Care Plan  Updates made by PViona Gilmore RNorwoodsince 07/20/2021 12:00 AM     Problem: Problem: Hypertension, Hyperlipidemia, Osteopenia, and Allergic Rhinitis      Long-Range Goal: Patient-Specific Goal   Start Date: 07/19/2021  Expected End Date: 07/19/2022  This Visit's Progress: On track  Priority: High  Note:   Current Barriers:  Unable to independently monitor therapeutic efficacy Unable to achieve control of cholesterol   Pharmacist Clinical Goal(s):  Patient will achieve adherence to monitoring guidelines and medication adherence to achieve therapeutic efficacy achieve control of cholesterol as evidenced by next lipid panel  through collaboration with PharmD and provider.   Interventions: 1:1 collaboration with JMartinique Betty G, MD regarding development and update of comprehensive plan of care as evidenced by provider attestation and co-signature Inter-disciplinary care team collaboration (see longitudinal plan of care) Comprehensive medication review performed; medication list updated in electronic medical record  Hypertension (BP goal <130/80) -Controlled -Current treatment: Amlodipine-valsartan 10-160 mg 1 tablet daily - Appropriate, Effective, Safe, Accessible Hydrochlorothiazide 12.5 mg 1 capsule daily in the morning - Appropriate, Effective, Safe, Accessible -Medications previously tried: none  -Current home readings: patient will not check at home as this stresses her out too much -Current dietary habits: limits salt intake -Current exercise habits: still walking regularly -Denies hypotensive/hypertensive symptoms -Educated on BP goals and benefits of medications for prevention of heart attack, stroke and kidney damage; Importance of home blood pressure monitoring; Proper BP monitoring technique; Symptoms of hypotension and importance of  maintaining adequate hydration; -Counseled to monitor BP at home weekly, document, and provide log at future appointments -Counseled on diet and exercise extensively Recommended to continue current medication Counseled on importance of checking BP at home with taking BP medications and patient declined.  Hyperlipidemia: (LDL goal < 100) -Uncontrolled -Current treatment: Rosuvastatin 10 mg 1 tablet three times a week (usually taking twice a week) - Appropriate, Query effective, Safe, Accessible -Medications previously tried: atorvastatin, pravastatin (increased uric acid level)  -Current dietary patterns: more stir fry; not a lot  of carbs and not as much bread; eating a lot more vegetables -Current exercise habits: walking almost every day -Educated on Cholesterol goals;  Benefits of statin for ASCVD risk reduction; Importance of limiting foods high in cholesterol; -Counseled on diet and exercise extensively Recommended to continue current medication Counseled on recommendation to increase to 4 times a week per PCP. Patient reported she will increase to 3 times a week more consistently instead of twice.  Osteopenia (Goal prevent fractures) -Controlled -Last DEXA Scan: 12/22  T-Score femoral neck: -1.8             T-Score total hip: n/a             T-Score lumbar spine: -0.5             T-Score forearm radius: -0.8             10-year probability of major osteoporotic fracture: 4.6%             10-year probability of hip fracture: 0.8% -Patient is not a candidate for pharmacologic treatment -Current treatment  Calcium-magnesium-vit D 500-250-400 units mg every morning Calcium with vitamin D 600 mg- 1000 units 1 tablet daily Vitamin D 2000 units 1 tablet daily -Medications previously tried: none  -Recommend [phone] units of vitamin D daily. Recommend 1200 mg of calcium daily from dietary and supplemental sources. Recommend weight-bearing and muscle strengthening exercises for  building and maintaining bone density. -Recommended for patient to stop the calcium with vitamin D once she finishes it up and then take cal-mag-vit D supplement twice daily.   Allergic rhinitis (Goal: minimize symptoms) -Controlled -Current treatment  Fluticasone 50 mcg/act use as needed - Appropriate, Effective, Safe, Accessible -Medications previously tried: none  -Recommended to continue current medication  Health Maintenance -Vaccine gaps: tetanus, shingrix -Current therapy:  Potassium chloride 20 mEq 1 tablet daily Magnesium 250 mg 1 tablet daily Ferrous sulfate 325 mg 1 tablet every other day -Educated on Cost vs benefit of each product must be carefully weighed by individual consumer -Patient is not satisfied with therapy and will discontinue ferrous sulfate. - Scheduled lab visit to recheck potassium level.  Patient Goals/Self-Care Activities Patient will:  - take medications as prescribed as evidenced by patient report and record review target a minimum of 150 minutes of moderate intensity exercise weekly  Follow Up Plan: The care management team will reach out to the patient again over the next 30 days.        Medication Assistance: None required.  Patient affirms current coverage meets needs.  Compliance/Adherence/Medication fill history: Care Gaps: Tetanus, shingrix BP- 130/84 (07/10/21)  Star-Rating Drugs: Amlodipine-Valsartan 10-160 mg - [facility] Rosuv[facility]atin (Crestor) 10 mg- Last [facility]  [facility]  [id number] [place] [id number]: [phone]: [phone] Uses pill box? Yes Pt endorses 80% compliance - does not take the rosuvastatin as prescribed  We discussed: Current pharmacy is preferred with insurance plan and patient is satisfied with pharmacy services Patient decided to: Continue current  medication management strategy  Care Plan and Follow Up Patient Decision:  Patient agrees to Care Plan and Follow-up.  Plan: The care management team will reach out to the patient again over the next 30 days.  MJeni Salles PharmD, BValdostaPharmacist LTillsonat BDyer

## 2021-07-20 NOTE — Patient Instructions (Signed)
Hi Kristen Wolf,  It was great to catch up with you again! Keep up the good work with working on you diet and exercising to bring down your cholesterol. The lower the better when it comes to reducing your chances of a heart attack or stroke so the more you can do and the more you can take the rosuvastatin, the better.  Please reach out to me if you have any questions or need anything before our follow up!  Best, Maddie  [name], PharmD, [facility]   Visit Information   Goals Addressed   None    Patient Care Plan: CCM Pharmacy Care Plan     Problem Identified: Problem: Hypertension, Hyperlipidemia, Osteopenia, and Allergic Rhinitis      Long-Range Goal: Patient-Specific Goal   Start Date: 07/19/2021  Expected End Date: 07/19/2022  This Visit's Progress: On track  Priority: High  Note:   Current Barriers:  Unable to independently monitor therapeutic efficacy Unable to achieve control of cholesterol   Pharmacist Clinical Goal(s):  Patient will achieve adherence to monitoring guidelines and medication adherence to achieve therapeutic efficacy achieve control of cholesterol as evidenced by next lipid panel  through collaboration with PharmD and provider.   Interventions: 1:1 collaboration with Martinique, Betty G, MD regarding development and update of comprehensive plan of care as evidenced by provider attestation and co-signature Inter-disciplinary care team collaboration (see longitudinal plan of care) Comprehensive medication review performed; medication list updated in electronic medical record  Hypertension (BP goal <130/80) -Controlled -Current treatment: Amlodipine-valsartan 10-160 mg 1 tablet daily - Appropriate, Effective, Safe, Accessible Hydrochlorothiazide 12.5 mg 1 capsule daily in the morning - Appropriate, Effective, Safe, Accessible -Medications previously tried: none  -Current home readings: patient  will not check at home as this stresses her out too much -Current dietary habits: limits salt intake -Current exercise habits: still walking regularly -Denies hypotensive/hypertensive symptoms -Educated on BP goals and benefits of medications for prevention of heart attack, stroke and kidney damage; Importance of home blood pressure monitoring; Proper BP monitoring technique; Symptoms of hypotension and importance of maintaining adequate hydration; -Counseled to monitor BP at home weekly, document, and provide log at future appointments -Counseled on diet and exercise extensively Recommended to continue current medication Counseled on importance of checking BP at home with taking BP medications and patient declined.  Hyperlipidemia: (LDL goal < 100) -Uncontrolled -Current treatment: Rosuvastatin 10 mg 1 tablet three times a week (usually taking twice a week) - Appropriate, Query effective, Safe, Accessible -Medications previously tried: atorvastatin, pravastatin (increased uric acid level)  -Current dietary patterns: more stir fry; not a lot of carbs and not as much bread; eating a lot more vegetables -Current exercise habits: walking almost every day -Educated on Cholesterol goals;  Benefits of statin for ASCVD risk reduction; Importance of limiting foods high in cholesterol; -Counseled on diet and exercise extensively Recommended to continue current medication Counseled on recommendation to increase to 4 times a week per PCP. Patient reported she will increase to 3 times a week more consistently instead of twice.  Osteopenia (Goal prevent fractures) -Controlled -Last DEXA Scan: 12/22  T-Score femoral neck: -1.8             T-Score total hip: n/a             T-Score lumbar spine: -0.5             T-Score forearm radius: -0.8  10-year probability of major osteoporotic fracture: 4.6%             10-year probability of hip fracture: 0.8% -Patient is not a candidate for  pharmacologic treatment -Current treatment  Calcium-magnesium-vit D 500-250-400 units mg every morning Calcium with vitamin D 600 mg- 1000 units 1 tablet daily Vitamin D 2000 units 1 tablet daily -Medications previously tried: none  -Recommend [phone] units of vitamin D daily. Recommend 1200 mg of calcium daily from dietary and supplemental sources. Recommend weight-bearing and muscle strengthening exercises for building and maintaining bone density. -Recommended for patient to stop the calcium with vitamin D once she finishes it up and then take cal-mag-vit D supplement twice daily.   Allergic rhinitis (Goal: minimize symptoms) -Controlled -Current treatment  Fluticasone 50 mcg/act use as needed - Appropriate, Effective, Safe, Accessible -Medications previously tried: none  -Recommended to continue current medication  Health Maintenance -Vaccine gaps: tetanus, shingrix -Current therapy:  Potassium chloride 20 mEq 1 tablet daily Magnesium 250 mg 1 tablet daily Ferrous sulfate 325 mg 1 tablet every other day -Educated on Cost vs benefit of each product must be carefully weighed by individual consumer -Patient is not satisfied with therapy and will discontinue ferrous sulfate. - Scheduled lab visit to recheck potassium level.  Patient Goals/Self-Care Activities Patient will:  - take medications as prescribed as evidenced by patient report and record review target a minimum of 150 minutes of moderate intensity exercise weekly  Follow Up Plan: The care management team will reach out to the patient again over the next 30 days.        Patient verbalizes understanding of instructions and care plan provided today and agrees to view in [place]. Active MyChart status confirmed with patient.   The pharmacy team will reach out to the patient again over the next 30 days.   Viona Gilmore, [facility]

## 2021-08-02 ENCOUNTER — Other Ambulatory Visit (INDEPENDENT_AMBULATORY_CARE_PROVIDER_SITE_OTHER): Payer: Medicare Other

## 2021-08-02 DIAGNOSIS — E876 Hypokalemia: Secondary | ICD-10-CM

## 2021-08-03 LAB — POTASSIUM: Potassium: 3.5 mEq/L (ref 3.5–5.1)

## 2021-08-04 ENCOUNTER — Other Ambulatory Visit: Payer: Self-pay | Admitting: Family Medicine

## 2021-08-04 DIAGNOSIS — E876 Hypokalemia: Secondary | ICD-10-CM

## 2021-08-04 MED ORDER — POTASSIUM CHLORIDE CRYS ER 20 MEQ PO TBCR: 20.0000 meq | EXTENDED_RELEASE_TABLET | Freq: Every day | ORAL | 0 refills | Status: DC

## 2021-08-15 DIAGNOSIS — I10 Essential (primary) hypertension: Secondary | ICD-10-CM | POA: Diagnosis not present

## 2021-08-15 DIAGNOSIS — E785 Hyperlipidemia, unspecified: Secondary | ICD-10-CM

## 2021-09-29 ENCOUNTER — Telehealth: Payer: Self-pay | Admitting: Pharmacist

## 2021-09-29 NOTE — Chronic Care Management (AMB) (Signed)
? ? ?Chronic Care Management ?Pharmacy Assistant  ? ?Name: Kristen Wolf  MRN: [id number] DOB: June 19, 1950 ? ?Reason for Encounter: Disease State ?  ?Conditions to be addressed/monitored: ?HLD ? ?Recent office visits:  ?None ? ?Recent consult visits:  ?None ? ?Hospital visits:  ?None in previous 6 months ? ?Medications: ?Outpatient Encounter Medications as of 09/29/2021  ?Medication Sig  ? amLODipine-valsartan (EXFORGE) 10-160 MG tablet Take 1 tablet by mouth daily.  ? Calcium-Magnesium (CAL-MAG PO) Take 500 mg by mouth every morning. Powder  ? fluticasone (FLONASE) 50 MCG/ACT nasal spray Place 1 spray into both nostrils daily. (Patient taking differently: Place 1 spray into both nostrils daily as needed.)  ? hydrochlorothiazide (MICROZIDE) 12.5 MG capsule TAKE 1 CAPSULE BY MOUTH ONCE DAILY IN THE MORNING  ? IRON PO Take 65 mg by mouth every other day.   ? potassium chloride SA (KLOR-CON M) 20 MEQ tablet Take 1 tablet (20 mEq total) by mouth daily.  ? rosuvastatin (CRESTOR) 10 MG tablet Take 1 tablet by mouth 3 times per week.  ? ?No facility-administered encounter medications on file as of 09/29/2021.  ? ?09/29/2021 ?Name: Kristen Wolf MRN: [id number] DOB: [birth year] ?Kristen Wolf is a 71 y.o. year old female who is a primary care patient of Martinique, Malka So, MD.  ?Comprehensive medication review performed; Spoke to patient regarding cholesterol ? ?Lipid Panel ?   ?Component Value Date/Time  ? CHOL 234 (H) 07/10/2021 0834  ? TRIG 121.0 07/10/2021 0834  ? HDL 69.80 07/10/2021 0834  ? [place] 140 (H) 07/10/2021 0834  ?  ?10-year ASCVD risk score: The 10-year ASCVD risk score (Arnett DK, et al., 2019) is: 16.3% ?  Values used to calculate the score: ?    Age: 31 years ?    Sex: Female ?    Is Non-Hispanic African American: Yes ?    Diabetic: No ?    Tobacco smoker: No ?    Systolic Blood Pressure: 517 mmHg ?    Is BP treated: Yes ?    HDL Cholesterol: 69.8 mg/dL ?    Total Cholesterol: 234  mg/dL ? ?Current antihyperlipidemic regimen:  ?Rosuvastatin 10 mg 1 tablet three times a week (usually taking twice a week) - Appropriate, Query effective, Safe, Accessible ?Previous antihyperlipidemic medications tried:  atorvastatin, pravastatin (increased uric acid level)  ?ASCVD risk enhancing conditions: age >25, HTN, and CKD ?What recent interventions/DTPs have been made by any provider to improve Cholesterol control since last CPP Visit: Patient reports no changes ?Any recent hospitalizations or ED visits since last visit with CPP? No ?Notes: ?Patient reports she has been tolerating the increase in the Statin well she reports she has increased to 4 times a week sometimes but mostly 3 times a week. She reports she has noticed some cramping but nothing regularly or major. Per Watt Climes advised her to schedule a follow up with PCO in July for follow up/ blood work at that time, patient reports she will hold off on that for now to see if she is still ok with the medication, and is ok to follow up with MP in Sept ? ?Adherence Review: ?Does the patient have >5 day gap between last estimated fill dates? No ? ? ?  ?Care Gaps: ?BP- 130/84 (07/10/21) ?AWV-  6/22 ?CCM- 9/23 ? ?Star Rating Drugs: ?Amlodipine-Valsartan 10-160 mg - [facility] ([facility]ified as [facility] but has not [facility] yet) ?Rosuvastatin (Crestor) 10 mg- Last  [facility]/04/21 [facility] ? ? ?Ned Clines CMA ?Clinical Pharmacist Assistant ?[phone] ? ?

## 2021-10-05 ENCOUNTER — Encounter: Payer: Self-pay | Admitting: Family Medicine

## 2021-10-11 ENCOUNTER — Other Ambulatory Visit: Payer: Self-pay | Admitting: Family Medicine

## 2021-10-16 ENCOUNTER — Other Ambulatory Visit: Payer: Self-pay | Admitting: Family Medicine

## 2021-10-16 DIAGNOSIS — I10 Essential (primary) hypertension: Secondary | ICD-10-CM

## 2021-10-18 ENCOUNTER — Other Ambulatory Visit: Payer: Self-pay | Admitting: Family Medicine

## 2021-10-18 DIAGNOSIS — E876 Hypokalemia: Secondary | ICD-10-CM

## 2021-11-27 ENCOUNTER — Ambulatory Visit (INDEPENDENT_AMBULATORY_CARE_PROVIDER_SITE_OTHER): Payer: Medicare Other

## 2021-11-27 VITALS — BP 129/87 | Ht 61.0 in | Wt 190.0 lb

## 2021-11-27 DIAGNOSIS — Z0000 Encounter for general adult medical examination without abnormal findings: Secondary | ICD-10-CM

## 2021-11-27 DIAGNOSIS — Z1231 Encounter for screening mammogram for malignant neoplasm of breast: Secondary | ICD-10-CM

## 2021-11-27 NOTE — Progress Notes (Signed)
Subjective:   Kristen Wolf is a 71 y.o. female who presents for Medicare Annual (Subsequent) preventive examination.  Review of Systems    Virtual Visit via Telephone Note  I connected with  [name] on 11/27/21 at  8:15 AM EDT by telephone and verified that I am speaking with the correct person using two identifiers.  Location: Patient: Home Provider: Office Persons participating in the virtual visit: patient/Nurse Health Advisor   I discussed the limitations, risks, security and privacy concerns of performing an evaluation and management service by telephone and the availability of in person appointments. The patient expressed understanding and agreed to proceed.  Interactive audio and video telecommunications were attempted between this nurse and patient, however failed, due to patient having technical difficulties OR patient did not have access to video capability.  We continued and completed visit with audio only.  Some vital signs may be absent or patient reported.   Kristen Peaches, LPN  Cardiac Risk Factors include: advanced age (>51mn, >>15women);hypertension;obesity (BMI >30kg/m2)     Objective:    Today's Vitals   11/27/21 0818  BP: 129/87  Weight: 190 lb (86.2 kg)  Height: '5\' 1"'  (1.549 m)   Body mass index is 35.9 kg/m.     11/27/2021    8:29 AM 11/25/2020    8:26 AM 02/05/2018    8:20 AM 01/05/2017    3:35 AM 05/15/2016   12:51 PM 04/27/2016    2:54 PM  Advanced Directives  Does Patient Have a Medical Advance Directive? No Yes No No No No  Type of ACorporate treasurerof [place] will      Copy of [place]?  No - copy requested      Would patient like information on creating a medical advance directive? No - Patient declined   No - Patient declined      Current Medications (verified) Outpatient Encounter Medications as of 11/27/2021  Medication Sig   amLODipine-valsartan (EXFORGE)  10-160 MG tablet Take 1 tablet by mouth daily.   Calcium-Magnesium (CAL-MAG PO) Take 500 mg by mouth every morning. Powder   fluticasone (FLONASE) 50 MCG/ACT nasal spray Place 1 spray into both nostrils daily. (Patient taking differently: Place 1 spray into both nostrils daily as needed.)   hydrochlorothiazide (MICROZIDE) 12.5 MG capsule TAKE 1 CAPSULE BY MOUTH ONCE DAILY IN THE MORNING   IRON PO Take 65 mg by mouth every other day.    KLOR-CON M20 20 MEQ tablet TAKE 1 TABLET BY MOUTH EVERY DAY   rosuvastatin (CRESTOR) 10 MG tablet TAKE 1 TABLET BY MOUTH 3 TIMES PER WEEK   No facility-administered encounter medications on file as of 11/27/2021.    Allergies (verified) Patient has no known allergies.   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   Cyst (solitary) of breast    L breast cyst, mamogram in September "unchanged"   Gout    Hyperlipidemia    Hypertension    Kidney cysts    Liver cyst    Past Surgical History:  Procedure Laterality Date   ABDOMINAL HYSTERECTOMY     1993   APPENDECTOMY     1980   COLONOSCOPY  03/2011   Hx rectal cancer   PARATHYROIDECTOMY     2005  PARATHYROIDECTOMY     2006   POLYPECTOMY     TUBAL LIGATION     1980   TUBAL LIGATION     Family History  Problem Relation Age of Onset   Asthma Mother    Hypertension Father    Multiple myeloma Sister    Colon cancer Neg Hx    Esophageal cancer Neg Hx    Stomach cancer Neg Hx    Social History   Socioeconomic History   Marital status: Divorced    Spouse name: Not on file   Number of children: Not on file   Years of education: Not on file   Highest education level: Not on file  Occupational History   Not on file  Tobacco Use   Smoking status: Never   Smokeless tobacco: Never  Vaping Use   Vaping Use: Never used  Substance and Sexual Activity   Alcohol use: No   Drug use: No    Sexual activity: Not on file  Other Topics Concern   Not on file  Social History Narrative   Not on file   Social Determinants of Health   Financial Resource Strain: Low Risk  (11/27/2021)   Overall Financial Resource Strain (CARDIA)    Difficulty of Paying Living Expenses: Not hard at all  Food Insecurity: No Food Insecurity (11/27/2021)   Hunger Vital Sign    Worried About Running Out of Food in the Last Year: Never true    Ran Out of Food in the Last Year: Never true  Transportation Needs: No Transportation Needs (11/27/2021)   PRAPARE - Hydrologist (Medical): No    Lack of Transportation (Non-Medical): No  Physical Activity: Sufficiently Active (11/27/2021)   Exercise Vital Sign    Days of Exercise per Week: 5 days    Minutes of Exercise per Session: 60 min  Stress: No Stress Concern Present (11/27/2021)   [place]    Feeling of Stress : Only a little  Social Connections: Moderately Integrated (11/27/2021)   Social Connection and Isolation Panel [NHANES]    Frequency of Communication with Friends and Family: More than three times a week    Frequency of Social Gatherings with Friends and Family: More than three times a week    Attends Religious Services: More than 4 times per year    Active Member of [employer] or Organizations: Yes    Attends Music therapist: More than 4 times per year    Marital Status: Divorced    Tobacco Counseling Counseling given: Not Answered   Clinical Intake:  Pre-visit preparation completed: Yes  Pain : No/denies pain     BMI - recorded: 37.48 Nutritional Status: BMI > 30  Obese Nutritional Risks: None Diabetes: No  How often do you need to have someone help you when you read instructions, pamphlets, or other written materials from your doctor or pharmacy?: 1 - Never  Diabetic?  No  Interpreter Needed?: NoActivities of Daily  Living    11/27/2021    8:26 AM 11/23/2021   11:05 AM  In your present state of health, do you have any difficulty performing the following activities:  Hearing? 0 0  Vision? 0 0  Difficulty concentrating or making decisions? 0 0  Walking or climbing stairs? 0 0  Dressing or bathing? 0 0  Doing errands, shopping? 0 0  Preparing Food and eating ? N N  Using the Toilet? N  N  In the past six months, have you accidently leaked urine? N Y  Do you have problems with loss of bowel control? N N  Managing your Medications? N N  Managing your Finances? N N  Housekeeping or managing your Housekeeping? N N    Patient Care Team: Martinique, Betty G, MD as PCP - General (Family Medicine) Viona Gilmore, [facility] as Pharmacist (Pharmacist)  Indicate any recent Medical Services you may have received from other than [facility] providers in the past year (date may be approximate).     Assessment:   This is a routine wellness examination for Remedy.  Hearing/Vision screen Hearing Screening - Comments:: No hearing difficulty Vision Screening - Comments:: Wears glasses [place] issues and exercise activities discussed: Exercise limited by: None identified   Goals Addressed               This Visit's Progress     Increase physical activity (pt-stated)         Depression Screen    11/27/2021    8:22 AM 07/10/2021    7:58 AM 11/25/2020    8:27 AM 11/25/2020    8:23 AM 11/28/2019    5:28 PM 02/05/2018    8:27 AM 05/01/2017   10:45 PM  PHQ 2/9 Scores  PHQ - 2 Score 0 0 0 0 0 0 0    Fall Risk    11/27/2021    8:28 AM 11/23/2021   11:05 AM 07/10/2021    7:58 AM 11/25/2020    8:27 AM 11/28/2019    5:29 PM  Fall Risk   Falls in the past year? 0 0 0 0 0  Number falls in past yr: 0 0 0 0 0  Injury with Fall? 0 0 0 0 0  Risk for fall due to : No Fall Risks   No Fall Risks   Follow up    Falls evaluation completed Education provided    FALL RISK PREVENTION PERTAINING TO  THE HOME:  Any stairs in or around the home? Yes  If so, are there any without handrails? No  Home free of loose throw rugs in walkways, pet beds, electrical cords, etc? Yes  Adequate lighting in your home to reduce risk of falls? Yes   ASSISTIVE DEVICES UTILIZED TO PREVENT FALLS:  Life alert? No  Use of a cane, walker or w/c? No  Grab bars in the bathroom? Yes  Shower chair or bench in shower? No  Elevated toilet seat or a handicapped toilet? No   TIMED UP AND GO:  Was the test performed? No . Audio Visit  Cognitive Function:      Immunizations Immunization History  Administered Date(s) Administered   Fluad Quad(high Dose 65+) 02/29/2020   Influenza, High Dose Seasonal PF 04/22/2018, 04/14/2019, 03/20/2021   PFIZER Comirnaty(Gray Top)Covid-19 Tri-Sucrose Vaccine 01/18/2021   PFIZER(Purple Top)SARS-COV-2 Vaccination 08/09/2019, 09/02/2019, 04/07/2020, 06/05/2021   PNEUMOCOCCAL CONJUGATE-20 09/21/2020   Pfizer Covid-19 Vaccine Bivalent Booster 69yr & up 06/05/2021   Pneumococcal Conjugate-13 08/16/2014   Pneumococcal Polysaccharide-23 05/01/2017   Tdap 01/15/2007    TDAP status: Due, Education has been provided regarding the importance of this vaccine. Advised may receive this vaccine at local pharmacy or Health Dept. Aware to provide a copy of the vaccination record if obtained from local pharmacy or Health Dept. Verbalized acceptance and understanding.  Flu Vaccine status: Up to date  Pneumococcal vaccine status: Up to date  Covid-19 vaccine status: Information  provided on how to obtain vaccines.   Qualifies for Shingles Vaccine? Yes   Zostavax completed No   Shingrix Completed?: No.    Education has been provided regarding the importance of this vaccine. Patient has been advised to call insurance company to determine out of pocket expense if they have not yet received this vaccine. Advised may also receive vaccine at local pharmacy or Health Dept. Verbalized  acceptance and understanding.  Screening Tests Health Maintenance  Topic Date Due   COVID-19 Vaccine (6 - Pfizer series) 12/13/2021 (Originally 07/31/2021)   TETANUS/TDAP  06/22/2022 (Originally 01/14/2017)   MAMMOGRAM  11/28/2022 (Originally 01/21/2019)   COLONOSCOPY (Pts 45-28yr Insurance coverage will need to be confirmed)  05/15/2026 (Originally 05/15/2021)   INFLUENZA VACCINE  01/16/2022   Pneumonia Vaccine 72 Years old  Completed   DEXA SCAN  Completed   Hepatitis C Screening  Completed   HPV VACCINES  Aged Out   Zoster Vaccines- Shingrix  Discontinued    Health Maintenance  There are no preventive care reminders to display for this patient.   Colorectal cancer screening: Type of screening: Colonoscopy. Completed 05/15/16. Repeat every 5 years  Mammogram status: Ordered 11/27/21. Pt provided with contact info and advised to call to schedule appt.   Bone Density status: Completed 05/24/21. Results reflect: Bone density results: OSTEOPOROSIS. Repeat every 2 years.  Lung Cancer Screening: (Low Dose CT Chest recommended if Age 72-80years, 30 pack-year currently smoking OR have quit w/in 15years.) does not qualify.    Additional Screening:  Hepatitis C Screening: does qualify; Completed 01/06/21  Vision Screening: Recommended annual ophthalmology exams for early detection of glaucoma and other disorders of the eye. Is the patient up to date with their annual eye exam?  Yes  Who is the provider or what is the name of the office in which the patient attends annual eye exams[facility] with a provider, would they like to be referred to a provider to establish care? No .   Dental Screening: Recommended annual dental exams for proper oral hygiene  Community Resource Referral / Chronic Care Management:  CRR required this visit?  No   CCM required this visit?  No      Plan:     I have personally reviewed and noted the following in the  patient's chart:   Medical and social history Use of alcohol, tobacco or illicit drugs  Current medications and supplements including opioid prescriptions.  Functional ability and status Nutritional status Physical activity Advanced directives List of other physicians Hospitalizations, surgeries, and ER visits in previous 12 months Vitals Screenings to include cognitive, depression, and falls Referrals and appointments  In addition, I have reviewed and discussed with patient certain preventive protocols, quality metrics, and best practice recommendations. A written personalized care plan for preventive services as well as general preventive health recommendations were provided to patient.     BCriselda Peaches LPN   [id number]  Nurse Notes: None

## 2021-11-27 NOTE — Patient Instructions (Addendum)
[name] , Thank you for taking time to come for your Medicare Wellness Visit. I appreciate your ongoing commitment to your health goals. Please review the following plan we discussed and let me know if I can assist you in the future.   These are the goals we discussed:  Goals       DIET - EAT MORE FRUITS AND VEGETABLES      Food diary. Try healthier snacks Continue daily walk.      Increase physical activity (pt-stated)        This is a list of the screening recommended for you and due dates:  Health Maintenance  Topic Date Due   COVID-19 Vaccine (6 - Pfizer series) 12/13/2021*   Tetanus Vaccine  06/22/2022*   Mammogram  11/28/2022*   Colon Cancer Screening  05/15/2026*   Flu Shot  01/16/2022   Pneumonia Vaccine  Completed   DEXA scan (bone density measurement)  Completed   Hepatitis C Screening: USPSTF Recommendation to screen - Ages 72-79 yo.  Completed   HPV Vaccine  Aged Out   Zoster (Shingles) Vaccine  Discontinued  *Topic was postponed. The date shown is not the original due date.   [name] ,   Advanced directives: No  Conditions/risks identified: None  Next appointment: Follow up in one year for your annual wellness visit     Preventive Care 65 Years and Older, Female Preventive care refers to lifestyle choices and visits with your health care provider that can promote health and wellness. What does preventive care include? A yearly physical exam. This is also called an annual well check. Dental exams once or twice a year. Routine eye exams. Ask your health care provider how often you should have your eyes checked. Personal lifestyle choices, including: Daily care of your teeth and gums. Regular physical activity. Eating a healthy diet. Avoiding tobacco and drug use. Limiting alcohol use. Practicing safe sex. Taking low-dose aspirin every day. Taking vitamin and mineral supplements as recommended by your health care provider. What happens  during an annual well check? The services and screenings done by your health care provider during your annual well check will depend on your age, overall health, lifestyle risk factors, and family history of disease. Counseling  Your health care provider may ask you questions about your: Alcohol use. Tobacco use. Drug use. Emotional well-being. Home and relationship well-being. Sexual activity. Eating habits. History of falls. Memory and ability to understand (cognition). Work and work Statistician. Reproductive health. Screening  You may have the following tests or measurements: Height, weight, and BMI. Blood pressure. Lipid and cholesterol levels. These may be checked every 5 years, or more frequently if you are over 41 years old. Skin check. Lung cancer screening. You may have this screening every year starting at age 72 if you have a 30-pack-year history of smoking and currently smoke or have quit within the past 15 years. Fecal occult blood test (FOBT) of the stool. You may have this test every year starting at age 72. Flexible sigmoidoscopy or colonoscopy. You may have a sigmoidoscopy every 5 years or a colonoscopy every 10 years starting at age 52. Hepatitis C blood test. Hepatitis B blood test. Sexually transmitted disease (STD) testing. Diabetes screening. This is done by checking your blood sugar (glucose) after you have not eaten for a while (fasting). You may have this done every 1-3 years. Bone density scan. This is done to screen for osteoporosis. You may have this done starting at  age 72. Mammogram. This may be done every 1-2 years. Talk to your health care provider about how often you should have regular mammograms. Talk with your health care provider about your test results, treatment options, and if necessary, the need for more tests. Vaccines  Your health care provider may recommend certain vaccines, such as: Influenza vaccine. This is recommended every  year. Tetanus, diphtheria, and acellular pertussis (Tdap, Td) vaccine. You may need a Td booster every 10 years. Zoster vaccine. You may need this after age 72. Pneumococcal 13-valent conjugate (PCV13) vaccine. One dose is recommended after age 72. Pneumococcal polysaccharide (PPSV23) vaccine. One dose is recommended after age 72. Talk to your health care provider about which screenings and vaccines you need and how often you need them. This information is not intended to replace advice given to you by your health care provider. Make sure you discuss any questions you have with your health care provider. Document Released: 07/01/2015 Document Revised: 02/22/2016 Document Reviewed: 04/05/2015 Elsevier Interactive Patient Education  2017 [place] Prevention in the Home Falls can cause injuries. They can happen to people of all ages. There are many things you can do to make your home safe and to help prevent falls. What can I do on the outside of my home? Regularly fix the edges of walkways and driveways and fix any cracks. Remove anything that might make you trip as you walk through a door, such as a raised step or threshold. Trim any bushes or trees on the path to your home. Use bright outdoor lighting. Clear any walking paths of anything that might make someone trip, such as rocks or tools. Regularly check to see if handrails are loose or broken. Make sure that both sides of any steps have handrails. Any raised decks and porches should have guardrails on the edges. Have any leaves, snow, or ice cleared regularly. Use sand or salt on walking paths during winter. Clean up any spills in your garage right away. This includes oil or grease spills. What can I do in the bathroom? Use night lights. Install grab bars by the toilet and in the tub and shower. Do not use towel bars as grab bars. Use non-skid mats or decals in the tub or shower. If you need to sit down in the shower, use a  plastic, non-slip stool. Keep the floor dry. Clean up any water that spills on the floor as soon as it happens. Remove soap buildup in the tub or shower regularly. Attach bath mats securely with double-sided non-slip rug tape. Do not have throw rugs and other things on the floor that can make you trip. What can I do in the bedroom? Use night lights. Make sure that you have a light by your bed that is easy to reach. Do not use any sheets or blankets that are too big for your bed. They should not hang down onto the floor. Have a firm chair that has side arms. You can use this for support while you get dressed. Do not have throw rugs and other things on the floor that can make you trip. What can I do in the kitchen? Clean up any spills right away. Avoid walking on wet floors. Keep items that you use a lot in easy-to-reach places. If you need to reach something above you, use a strong step stool that has a grab bar. Keep electrical cords out of the way. Do not use floor polish or wax that makes floors  slippery. If you must use wax, use non-skid floor wax. Do not have throw rugs and other things on the floor that can make you trip. What can I do with my stairs? Do not leave any items on the stairs. Make sure that there are handrails on both sides of the stairs and use them. Fix handrails that are broken or loose. Make sure that handrails are as long as the stairways. Check any carpeting to make sure that it is firmly attached to the stairs. Fix any carpet that is loose or worn. Avoid having throw rugs at the top or bottom of the stairs. If you do have throw rugs, attach them to the floor with carpet tape. Make sure that you have a light switch at the top of the stairs and the bottom of the stairs. If you do not have them, ask someone to add them for you. What else can I do to help prevent falls? Wear shoes that: Do not have high heels. Have rubber bottoms. Are comfortable and fit you  well. Are closed at the toe. Do not wear sandals. If you use a stepladder: Make sure that it is fully opened. Do not climb a closed stepladder. Make sure that both sides of the stepladder are locked into place. Ask someone to hold it for you, if possible. Clearly mark and make sure that you can see: Any grab bars or handrails. First and last steps. Where the edge of each step is. Use tools that help you move around (mobility aids) if they are needed. These include: Canes. Walkers. Scooters. Crutches. Turn on the lights when you go into a dark area. Replace any light bulbs as soon as they burn out. Set up your furniture so you have a clear path. Avoid moving your furniture around. If any of your floors are uneven, fix them. If there are any pets around you, be aware of where they are. Review your medicines with your doctor. Some medicines can make you feel dizzy. This can increase your chance of falling. Ask your doctor what other things that you can do to help prevent falls. This information is not intended to replace advice given to you by your health care provider. Make sure you discuss any questions you have with your health care provider. Document Released: 03/31/2009 Document Revised: 11/10/2015 Document Reviewed: 07/09/2014 Elsevier Interactive Patient Education  2017 [employer].

## 2021-12-11 DIAGNOSIS — H2513 Age-related nuclear cataract, bilateral: Secondary | ICD-10-CM | POA: Diagnosis not present

## 2021-12-11 DIAGNOSIS — H35033 Hypertensive retinopathy, bilateral: Secondary | ICD-10-CM | POA: Diagnosis not present

## 2021-12-11 DIAGNOSIS — H52222 Regular astigmatism, left eye: Secondary | ICD-10-CM | POA: Diagnosis not present

## 2021-12-11 DIAGNOSIS — I10 Essential (primary) hypertension: Secondary | ICD-10-CM | POA: Diagnosis not present

## 2021-12-11 DIAGNOSIS — H5203 Hypermetropia, bilateral: Secondary | ICD-10-CM | POA: Diagnosis not present

## 2021-12-11 DIAGNOSIS — H524 Presbyopia: Secondary | ICD-10-CM | POA: Diagnosis not present

## 2021-12-13 ENCOUNTER — Ambulatory Visit
Admission: RE | Admit: 2021-12-13 | Discharge: 2021-12-13 | Disposition: A | Discharge status: Home / Self Care | Payer: Medicare Other | Source: Ambulatory Visit | Attending: Family Medicine | Admitting: Family Medicine

## 2021-12-13 DIAGNOSIS — Z1231 Encounter for screening mammogram for malignant neoplasm of breast: Secondary | ICD-10-CM

## 2021-12-14 ENCOUNTER — Other Ambulatory Visit: Payer: Self-pay | Admitting: Family Medicine

## 2021-12-14 DIAGNOSIS — R928 Other abnormal and inconclusive findings on diagnostic imaging of breast: Secondary | ICD-10-CM

## 2021-12-21 ENCOUNTER — Ambulatory Visit
Admission: RE | Admit: 2021-12-21 | Discharge: 2021-12-21 | Disposition: A | Discharge status: Home / Self Care | Payer: Medicare Other | Source: Ambulatory Visit | Attending: Family Medicine | Admitting: Family Medicine

## 2021-12-21 ENCOUNTER — Other Ambulatory Visit: Payer: Self-pay | Admitting: Family Medicine

## 2021-12-21 DIAGNOSIS — N6310 Unspecified lump in the right breast, unspecified quadrant: Secondary | ICD-10-CM

## 2021-12-21 DIAGNOSIS — N6325 Unspecified lump in the left breast, overlapping quadrants: Secondary | ICD-10-CM | POA: Diagnosis not present

## 2021-12-21 DIAGNOSIS — R928 Other abnormal and inconclusive findings on diagnostic imaging of breast: Secondary | ICD-10-CM

## 2022-01-12 ENCOUNTER — Other Ambulatory Visit: Payer: Self-pay | Admitting: Family Medicine

## 2022-01-12 DIAGNOSIS — E876 Hypokalemia: Secondary | ICD-10-CM

## 2022-02-08 DIAGNOSIS — Z23 Encounter for immunization: Secondary | ICD-10-CM | POA: Diagnosis not present

## 2022-03-02 ENCOUNTER — Telehealth: Payer: Self-pay | Admitting: Pharmacist

## 2022-03-02 NOTE — Chronic Care Management (AMB) (Signed)
    Chronic Care Management Pharmacy Assistant   Name: Bralyn Espino  MRN: [id number] DOB: [birth year]  03/02/22 APPOINTMENT REMINDER    Patient was reminded to have all medications, supplements and any blood glucose and blood pressure readings available for review with [name], Pharm. D, for telephone visit on 03/05/22 at 12.    Care Gaps: COVID Booster - Overdue TDAP - Postponed Colonoscopy - Postponed BP- 130/84 07/10/21 AWV- 6/23   Star Rating Drug: Amlodipine-Valsartan 10-160 mg - Last filled 01/15/22 90 DS at [facility]  Rosuvastatin (Crestor) 10 mg- Last filled 02/24/22 70 DS at [facility]     Medications: Outpatient Encounter Medications as of 03/02/2022  Medication Sig   amLODipine-valsartan (EXFORGE) 10-160 MG tablet Take 1 tablet by mouth daily.   Calcium-Magnesium (CAL-MAG PO) Take 500 mg by mouth every morning. Powder   fluticasone (FLONASE) 50 MCG/ACT nasal spray Place 1 spray into both nostrils daily. (Patient taking differently: Place 1 spray into both nostrils daily as needed.)   hydrochlorothiazide (MICROZIDE) 12.5 MG capsule TAKE 1 CAPSULE BY MOUTH ONCE DAILY IN THE MORNING   IRON PO Take 65 mg by mouth every other day.    KLOR-CON M20 20 MEQ tablet TAKE 1 TABLET BY MOUTH EVERY DAY   rosuvastatin (CRESTOR) 10 MG tablet TAKE 1 TABLET BY MOUTH 3 TIMES PER WEEK   No facility-administered encounter medications on file as of 03/02/2022.    [place] Clinical Pharmacist Assistant [phone]

## 2022-03-05 ENCOUNTER — Ambulatory Visit (INDEPENDENT_AMBULATORY_CARE_PROVIDER_SITE_OTHER): Payer: Medicare Other | Admitting: Pharmacist

## 2022-03-05 DIAGNOSIS — E785 Hyperlipidemia, unspecified: Secondary | ICD-10-CM

## 2022-03-05 DIAGNOSIS — I10 Essential (primary) hypertension: Secondary | ICD-10-CM

## 2022-03-05 NOTE — Patient Instructions (Signed)
Hi Kristen Wolf,  It was great to catch up again! I am glad you are doing well and keep up the good work with taking care of yourself!   Please reach out to me if you have any questions or need anything before our follow up!  Best, Maddie  [name], PharmD, [place] Pharmacist [facility]   Visit Information   Goals Addressed   None    Patient Care Plan: CCM Pharmacy Care Plan     Problem Identified: Problem: Hypertension, Hyperlipidemia, Osteopenia, and Allergic Rhinitis      Long-Range Goal: Patient-Specific Goal   Start Date: 07/19/2021  Expected End Date: 07/19/2022  Recent Progress: On track  Priority: High  Note:   Current Barriers:  Unable to independently monitor therapeutic efficacy Unable to achieve control of cholesterol   Pharmacist Clinical Goal(s):  Patient will achieve adherence to monitoring guidelines and medication adherence to achieve therapeutic efficacy achieve control of cholesterol as evidenced by next lipid panel  through collaboration with PharmD and provider.   Interventions: 1:1 collaboration with [place], Betty G, MD regarding development and update of comprehensive plan of care as evidenced by provider attestation and co-signature Inter-disciplinary care team collaboration (see longitudinal plan of care) Comprehensive medication review performed; medication list updated in electronic medical record  Hypertension (BP goal <130/80) -Controlled -Current treatment: Amlodipine-valsartan 10-160 mg 1 tablet daily - Appropriate, Effective, Safe, Accessible Hydrochlorothiazide 12.5 mg 1 capsule daily in the morning - Appropriate, Effective, Safe, Accessible -Medications previously tried: none  -Current home readings: 121/70 HR 95; this seems to be normal for her -Current dietary habits: limits salt intake -Current exercise habits: still walking regularly -Denies hypotensive/hypertensive symptoms -Educated on  BP goals and benefits of medications for prevention of heart attack, stroke and kidney damage; Importance of home blood pressure monitoring; Proper BP monitoring technique; Symptoms of hypotension and importance of maintaining adequate hydration; -Counseled to monitor BP at home weekly, document, and provide log at future appointments -Counseled on diet and exercise extensively Recommended to continue current medication  Hyperlipidemia: (LDL goal < 100) -Uncontrolled -Current treatment: Rosuvastatin 10 mg 1 tablet three times a week (usually taking twice a week) - Appropriate, Query effective, Safe, Accessible -Medications previously tried: atorvastatin, pravastatin (increased uric acid level)  -Current dietary patterns: more stir fry; not a lot of carbs and not as much bread; eating a lot more vegetables -Current exercise habits: walking almost every day -Educated on Cholesterol goals;  Benefits of statin for ASCVD risk reduction; Importance of limiting foods high in cholesterol; -Counseled on diet and exercise extensively Recommended to continue current medication Recommended repeat lipid panel and consider add on Zetia as pt can only tolerate 3 times weekly statin.  Osteopenia (Goal prevent fractures) -Controlled -Last DEXA Scan: 12/22  T-Score femoral neck: -1.8             T-Score total hip: n/a             T-Score lumbar spine: -0.5             T-Score forearm radius: -0.8             10-year probability of major osteoporotic fracture: 4.6%             10-year probability of hip fracture: 0.8% -Patient is not a candidate for pharmacologic treatment -Current treatment  Calcium-magnesium-vit D 500-250-400 units mg every morning - Appropriate, Effective, Safe, Accessible Vitamin D 2000 units 1 tablet daily - Appropriate,  Effective, Safe, Accessible -Medications previously tried: none  -Recommend [phone] units of vitamin D daily. Recommend 1200 mg of calcium daily from dietary  and supplemental sources. Recommend weight-bearing and muscle strengthening exercises for building and maintaining bone density. -Recommended for patient to stop the calcium with vitamin D once she finishes it up and then take cal-mag-vit D supplement twice daily.   Allergic rhinitis (Goal: minimize symptoms) -Controlled -Current treatment  Fluticasone 50 mcg/act use as needed - Appropriate, Effective, Safe, Accessible -Medications previously tried: none  -Recommended to continue current medication  Health Maintenance -Vaccine gaps: tetanus, shingrix -Current therapy:  Potassium chloride 20 mEq 1 tablet daily Magnesium 250 mg 1 tablet daily -Educated on Cost vs benefit of each product must be carefully weighed by individual consumer -Patient is satisfied with current therapy and denies issues -Recommended to continue current medication  Patient Goals/Self-Care Activities Patient will:  - take medications as prescribed as evidenced by patient report and record review target a minimum of 150 minutes of moderate intensity exercise weekly  Follow Up Plan: Telephone follow up appointment with care management team member scheduled for:1 year       Patient verbalizes understanding of instructions and care plan provided today and agrees to view in [place]. Active MyChart status and patient understanding of how to access instructions and care plan via MyChart confirmed with patient.    Telephone follow up appointment with pharmacy team member scheduled for:1 year  Viona Gilmore, [facility]
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
## 2022-03-12 NOTE — Progress Notes (Unsigned)
HPI: [name] is a 71 y.o. female, who is here today for chronic disease management.  Last seen on 07/10/21 No new problems since her last visit. She has been walking daily and following a healthful diet.  Hyperlipidemia: Currently on Crestor 10 mg at least three times per week, if myalgias, she decreases it to 2/week. Lab Results  Component Value Date   CHOL 234 (H) 07/10/2021   HDL 69.80 07/10/2021   LDLCALC 140 (H) 07/10/2021   TRIG 121.0 07/10/2021   CHOLHDL 3 07/10/2021   Hypertension:  Medications: Amlodipine-Valsartan 10-160 mg daily and Hydrochlorothiazide 12.5 mg daily. BP readings at home:Occasionally, it causes stress. Side effects: None Negative for unusual or severe headache, visual changes, exertional chest pain, dyspnea,  focal weakness, or edema. CKD III: She is drinking lemon with water, heard it was good for the kidneys. She has not noted foam in urine,gross hematuria, or decreased urine output. Hypokalemia on K-Lor 20 mK daily. Lab Results  Component Value Date   CREATININE 1.10 07/10/2021   BUN 16 07/10/2021   NA 137 07/10/2021   K 3.5 08/02/2021   CL 98 07/10/2021   CO2 30 07/10/2021   Review of Systems  Constitutional:  Negative for activity change, appetite change and fever.  HENT:  Negative for mouth sores and nosebleeds.   Respiratory:  Negative for cough and wheezing.   Gastrointestinal:  Negative for abdominal pain, nausea and vomiting.       Negative for changes in bowel habits.  Genitourinary:  Negative for difficulty urinating.  Skin:  Negative for rash.  Neurological:  Negative for syncope and facial asymmetry.  Rest of ROS see pertinent positives and negatives in HPI.  Current Outpatient Medications on File Prior to Visit  Medication Sig Dispense Refill   amLODipine-valsartan (EXFORGE) 10-160 MG tablet Take 1 tablet by mouth daily. 90 tablet 2   Calcium-Magnesium (CAL-MAG PO) Take 600 mg by mouth every morning. 1000  units of vitamin D     fluticasone (FLONASE) 50 MCG/ACT nasal spray Place 1 spray into both nostrils daily. (Patient taking differently: Place 1 spray into both nostrils daily as needed.) 16 g 6   hydrochlorothiazide (MICROZIDE) 12.5 MG capsule TAKE 1 CAPSULE BY MOUTH ONCE DAILY IN THE MORNING 90 capsule 2   KLOR-CON M20 20 MEQ tablet TAKE 1 TABLET BY MOUTH EVERY DAY 90 tablet 0   rosuvastatin (CRESTOR) 10 MG tablet TAKE 1 TABLET BY MOUTH 3 TIMES PER WEEK 30 tablet 3   No current facility-administered medications on file prior to visit.   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   Cyst (solitary) of breast    L breast cyst, mamogram in September "unchanged"   Gout    Hyperlipidemia    Hypertension    Kidney cysts    Liver cyst    No Known Allergies  Social History   Socioeconomic History   Marital status: Divorced    Spouse name: Not on file   Number of children: Not on file   Years of education: Not on file   Highest education level: Some college, no degree  Occupational History   Not on file  Tobacco Use   Smoking status: Never   Smokeless tobacco: Never  Vaping Use   Vaping Use: Never used  Substance and Sexual Activity  Alcohol use: No   Drug use: No   Sexual activity: Not on file  Other Topics Concern   Not on file  Social History Narrative   Not on file   Social Determinants of Health   Financial Resource Strain: Low Risk  (03/09/2022)   Overall Financial Resource Strain (CARDIA)    Difficulty of Paying Living Expenses: Not very hard  Food Insecurity: No Food Insecurity (03/09/2022)   Hunger Vital Sign    Worried About Running Out of Food in the Last Year: Never true    Ran Out of Food in the Last Year: Never true  Transportation Needs: No Transportation Needs (03/09/2022)   PRAPARE - Hydrologist (Medical): No    Lack of  Transportation (Non-Medical): No  Physical Activity: Sufficiently Active (03/09/2022)   Exercise Vital Sign    Days of Exercise per Week: 5 days    Minutes of Exercise per Session: 70 min  Stress: No Stress Concern Present (03/09/2022)   [place]    Feeling of Stress : Only a little  Social Connections: Moderately Integrated (03/09/2022)   Social Connection and Isolation Panel [NHANES]    Frequency of Communication with Friends and Family: More than three times a week    Frequency of Social Gatherings with Friends and Family: More than three times a week    Attends Religious Services: More than 4 times per year    Active Member of Clubs or Organizations: Yes    Attends Archivist Meetings: More than 4 times per year    Marital Status: Divorced   Vitals:   03/13/22 0846  BP: 128/78  Pulse: 100  Resp: 12  Temp: 98.7 F (37.1 C)  SpO2: 96%  Body mass index is 37.03 kg/m.  Physical Exam Vitals and nursing note reviewed.  Constitutional:      General: She is not in acute distress.    Appearance: She is well-developed and well-groomed.  HENT:     Head: Normocephalic and atraumatic.     Mouth/Throat:     Mouth: Mucous membranes are moist.     Pharynx: Oropharynx is clear.  Eyes:     Conjunctiva/sclera: Conjunctivae normal.  Cardiovascular:     Rate and Rhythm: Normal rate and regular rhythm.     Pulses:          Dorsalis pedis pulses are 2+ on the right side and 2+ on the left side.     Heart sounds: No murmur heard.    Comments: Trace pitting edema LE, bilateral. Pulmonary:     Effort: Pulmonary effort is normal. No respiratory distress.     Breath sounds: Normal breath sounds.  Abdominal:     Palpations: Abdomen is soft. There is no hepatomegaly or mass.     Tenderness: There is no abdominal tenderness.  Lymphadenopathy:     Cervical: No cervical adenopathy.  Skin:    General: Skin is warm.      Findings: No erythema or rash.  Neurological:     General: No focal deficit present.     Mental Status: She is alert and oriented to person, place, and time.     Cranial Nerves: No cranial nerve deficit.     Gait: Gait normal.  Psychiatric:        Mood and Affect: Mood and affect normal.   ASSESSMENT AND PLAN:  Kristen Wolf was seen today for follow-up.  Diagnoses and  all orders for this visit:  Orders Placed This Encounter  Procedures   Comprehensive metabolic panel   Lab Results  Component Value Date   ALT 29 03/13/2022   AST 28 03/13/2022   ALKPHOS 90 03/13/2022   BILITOT 0.5 03/13/2022   Lab Results  Component Value Date   CREATININE 1.26 (H) 03/13/2022   BUN 19 03/13/2022   NA 137 03/13/2022   K 3.5 03/13/2022   CL 100 03/13/2022   CO2 28 03/13/2022   Hyperlipidemia We discussed son CV benefits of statins. Continue Crestor 10 mg 2-3 times per week and low-fat diet. She has tried different statins and the "all" have caused myalgias arthralgias.  Essential hypertension, benign BP adequately controlled. Continue Amlodipine-Valsartan 10-160 mg daily and Hydrochlorothiazide 12.5 mg daily. Low-salt/DASH diet to continue. Eye exam is current.  CKD (chronic kidney disease), stage III ([place]) We discussed diagnosis and prognosis. Problem has been stable,Cr 1.0-1.1 and e GFR 50's. Continue adequate hydration and BP control. Recommend low-salt diet and avoidance of NSAIDs.  Hypokalemia Potassium improved with KLOR 20 meq daily. For now no changes in HCTZ dose, which could be a contributing factor. Further recommendation will be given according to lab results.  Statin myopathy Reports trying several medications to treat HLD and "all" caused/worsened joint pain and myalgias.  Return in about 6 months (around 09/11/2022).  Gian Ybarra G. Martinique, MD  [facility]. [place] office.

## 2022-03-13 ENCOUNTER — Ambulatory Visit (INDEPENDENT_AMBULATORY_CARE_PROVIDER_SITE_OTHER): Payer: Medicare Other | Admitting: Family Medicine

## 2022-03-13 ENCOUNTER — Encounter: Payer: Self-pay | Admitting: Family Medicine

## 2022-03-13 VITALS — BP 128/78 | HR 100 | Temp 98.7°F | Resp 12 | Ht 61.0 in | Wt 196.0 lb

## 2022-03-13 DIAGNOSIS — G720 Drug-induced myopathy: Secondary | ICD-10-CM

## 2022-03-13 DIAGNOSIS — I10 Essential (primary) hypertension: Secondary | ICD-10-CM

## 2022-03-13 DIAGNOSIS — E876 Hypokalemia: Secondary | ICD-10-CM | POA: Diagnosis not present

## 2022-03-13 DIAGNOSIS — T466X5A Adverse effect of antihyperlipidemic and antiarteriosclerotic drugs, initial encounter: Secondary | ICD-10-CM | POA: Diagnosis not present

## 2022-03-13 DIAGNOSIS — N1831 Chronic kidney disease, stage 3a: Secondary | ICD-10-CM

## 2022-03-13 DIAGNOSIS — E785 Hyperlipidemia, unspecified: Secondary | ICD-10-CM

## 2022-03-13 LAB — COMPREHENSIVE METABOLIC PANEL
ALT: 29 U/L (ref 0–35)
AST: 28 U/L (ref 0–37)
Albumin: 4.2 g/dL (ref 3.5–5.2)
Alkaline Phosphatase: 90 U/L (ref 39–117)
BUN: 19 mg/dL (ref 6–23)
CO2: 28 mEq/L (ref 19–32)
Calcium: 10 mg/dL (ref 8.4–10.5)
Chloride: 100 mEq/L (ref 96–112)
Creatinine, Ser: 1.26 mg/dL — ABNORMAL HIGH (ref 0.40–1.20)
GFR: 42.59 mL/min — ABNORMAL LOW (ref >60.00)
Glucose, Bld: 102 mg/dL — ABNORMAL HIGH (ref 70–99)
Potassium: 3.5 mEq/L (ref 3.5–5.1)
Sodium: 137 mEq/L (ref 135–145)
Total Bilirubin: 0.5 mg/dL (ref 0.2–1.2)
Total Protein: 7.9 g/dL (ref 6.0–8.3)

## 2022-03-13 MED ORDER — AMLODIPINE BESYLATE-VALSARTAN 10-160 MG PO TABS: 1.0000 | ORAL_TABLET | Freq: Every day | ORAL | 2 refills | Status: DC

## 2022-03-13 MED ORDER — POTASSIUM CHLORIDE CRYS ER 20 MEQ PO TBCR: 20.0000 meq | EXTENDED_RELEASE_TABLET | Freq: Every day | ORAL | 2 refills | Status: DC

## 2022-03-13 NOTE — Assessment & Plan Note (Signed)
BP adequately controlled. Continue Amlodipine-Valsartan 10-160 mg daily and Hydrochlorothiazide 12.5 mg daily. Low-salt/DASH diet to continue. Eye exam is current.

## 2022-03-13 NOTE — Assessment & Plan Note (Signed)
Reports trying several medications to treat HLD and "all" caused/worsened joint pain and myalgias.

## 2022-03-13 NOTE — Assessment & Plan Note (Signed)
We discussed diagnosis and prognosis. Problem has been stable,Cr 1.0-1.1 and e GFR 50's. Continue adequate hydration and BP control. Recommend low-salt diet and avoidance of NSAIDs.

## 2022-03-13 NOTE — Assessment & Plan Note (Signed)
Potassium improved with KLOR 20 meq daily. For now no changes in HCTZ dose, which could be a contributing factor. Further recommendation will be given according to lab results.

## 2022-03-13 NOTE — Assessment & Plan Note (Signed)
We discussed son CV benefits of statins. Continue Crestor 10 mg 2-3 times per week and low-fat diet. She has tried different statins and the "all" have caused myalgias arthralgias.

## 2022-03-13 NOTE — Patient Instructions (Addendum)
A few things to remember from today's visit:  Essential hypertension, benign - Plan: Comprehensive metabolic panel  Hyperlipidemia, unspecified hyperlipidemia type - Plan: Comprehensive metabolic panel  Statin myopathy  No changes today. Continue walking daily for at least 30 min. Low salt diet. If any pain jut tylenol, 500 mg 3-4 times per day as needed.  If you need refills for medications you take chronically, please call your pharmacy. Do not use My Chart to request refills or for acute issues that need immediate attention. If you send a my chart message, it may take a few days to be addressed, specially if I am not in the office.  Please be sure medication list is accurate. If a new problem present, please set up appointment sooner than planned today.

## 2022-03-17 DIAGNOSIS — I10 Essential (primary) hypertension: Secondary | ICD-10-CM

## 2022-03-17 DIAGNOSIS — E785 Hyperlipidemia, unspecified: Secondary | ICD-10-CM

## 2022-06-25 ENCOUNTER — Ambulatory Visit
Admission: RE | Admit: 2022-06-25 | Discharge: 2022-06-25 | Disposition: A | Discharge status: Home / Self Care | Payer: Medicare Other | Source: Ambulatory Visit | Attending: Family Medicine | Admitting: Family Medicine

## 2022-06-25 DIAGNOSIS — N6313 Unspecified lump in the right breast, lower outer quadrant: Secondary | ICD-10-CM | POA: Diagnosis not present

## 2022-06-25 DIAGNOSIS — N6310 Unspecified lump in the right breast, unspecified quadrant: Secondary | ICD-10-CM

## 2022-06-25 DIAGNOSIS — N6314 Unspecified lump in the right breast, lower inner quadrant: Secondary | ICD-10-CM | POA: Diagnosis not present

## 2022-07-07 ENCOUNTER — Other Ambulatory Visit: Payer: Self-pay | Admitting: Family Medicine

## 2022-07-09 DIAGNOSIS — Z23 Encounter for immunization: Secondary | ICD-10-CM | POA: Diagnosis not present

## 2022-08-23 ENCOUNTER — Other Ambulatory Visit: Payer: Self-pay | Admitting: Family Medicine

## 2022-08-23 DIAGNOSIS — I10 Essential (primary) hypertension: Secondary | ICD-10-CM

## 2022-08-29 ENCOUNTER — Telehealth: Payer: Self-pay

## 2022-08-29 NOTE — Progress Notes (Signed)
Patient ID: Kristen Wolf, female   DOB: August 03, 1950, 72 y.o.   MRN: [id number]  Care Management & Coordination Services Pharmacy Team  Reason for Encounter: Hyperlipidemia / Cholesterol  Contacted patient to discuss hyperlipidemia disease state. Spoke with patient on 08/29/2022    Current antihyperlipidemic regimen:  Rosuvastatin 10 mg 1 tablet three times a week (usually taking twice a week)  ASCVD risk enhancing conditions: age >27 and HTN 10-year ASCVD risk score: The 10-year ASCVD risk score (Arnett DK, et al., 2019) is: 16.9%   Values used to calculate the score:     Age: 72 years     Sex: Female     Is Non-Hispanic African American: Yes     Diabetic: No     Tobacco smoker: No     Systolic Blood Pressure: 0000000 mmHg     Is BP treated: Yes     HDL Cholesterol: 69.8 mg/dL     Total Cholesterol: 234 mg/dL What recent interventions/DTPs have been made by any provider to improve Cholesterol control since last CPP Visit: Patient reports none Any recent hospitalizations or ED visits since last visit with CPP? No   Current antihypertensive regimen:  Amlodipine-valsartan 10-160 mg 1 tablet daily Hydrochlorothiazide 12.5 mg 1 capsule daily in the morning  Patient verbally confirms she is taking the above medications as directed. Yes  How often are you checking your Blood Pressure? infrequently  she checks her blood pressure in the afternoon after taking her medication.  Current home BP readings: 120/70 patient reports she recalls. Patient reports she really is only checking if she has a headache and that is not very often to see if it is high. She reports she will adopt checking at least once a week to stay on top of it.  Any readings above 180/100? No If yes any symptoms of hypertensive emergency? patient denies any symptoms of high blood pressure  What recent interventions/DTPs have been made by any provider to improve Blood Pressure control since last CPP Visit: Patient  reports none  Any recent hospitalizations or ED visits since last visit with CPP? No  What diet changes have been made to improve Blood Pressure Control?  Patient reports she has really been making an effort to eat healthy and make best food choices and not eating much salt.  What exercise is being done to improve your Blood Pressure Control?  Patient reports she has started her walking back up about 2-3 times and is planning to increase to daily once its warmer in the mornings.    Chart Updates: Recent office visits:  03/13/22 - Patient presented for Essential hypertension and other concerns. Changed Potassium Chloride.   Recent consult visits:  None   Hospital visits:  None in previous 6 months   Adherence Review: Is the patient currently on ACE/ARB medication? Yes Does the patient have >5 day gap between last estimated fill dates? No  Star Rating Drugs:  Amlodipine-Valsartan 10-160 mg - Last filled 07/11/22 90 DS at [facility]  Rosuvastatin (Crestor) 10 mg- Last filled 07/09/22 70 DS at [facility]  Pharm follow up 02/2023  Medications: Outpatient Encounter Medications as of 08/29/2022  Medication Sig   amLODipine-valsartan (EXFORGE) 10-160 MG tablet Take 1 tablet by mouth daily.   Calcium-Magnesium (CAL-MAG PO) Take 600 mg by mouth every morning. 1000 units of vitamin D   fluticasone (FLONASE) 50 MCG/ACT nasal spray Place 1 spray into both nostrils daily. (Patient taking differently: Place 1 spray into both nostrils daily as  needed.)   hydrochlorothiazide (MICROZIDE) 12.5 MG capsule TAKE 1 CAPSULE BY MOUTH ONCE DAILY IN THE MORNING   potassium chloride SA (KLOR-CON M20) 20 MEQ tablet Take 1 tablet (20 mEq total) by mouth daily.   rosuvastatin (CRESTOR) 10 MG tablet TAKE 1 TABLET BY MOUTH 3 TIMES PER WEEK   No facility-administered encounter medications on file as of 08/29/2022.    Lipid Panel    Component Value Date/Time   CHOL 234 (H) 07/10/2021 0834   TRIG 121.0 07/10/2021 0834    HDL 69.80 07/10/2021 0834   LDLCALC 140 (H) 07/10/2021 0834     Kidney Function Lab Results  Component Value Date/Time   CREATININE 1.26 (H) 03/13/2022 09:16 AM   CREATININE 1.10 07/10/2021 08:34 AM   CREATININE 1.15 (H) 01/12/2020 08:47 AM   GFR 42.59 (L) 03/13/2022 09:16 AM   GFRNONAA 48 (L) 01/05/2017 04:04 AM   GFRAA 55 (L) 01/05/2017 04:04 AM      [place] Clinical Pharmacist Assistant [phone]

## 2022-09-04 DIAGNOSIS — H5203 Hypermetropia, bilateral: Secondary | ICD-10-CM | POA: Diagnosis not present

## 2022-09-04 DIAGNOSIS — H52222 Regular astigmatism, left eye: Secondary | ICD-10-CM | POA: Diagnosis not present

## 2022-09-04 DIAGNOSIS — H524 Presbyopia: Secondary | ICD-10-CM | POA: Diagnosis not present

## 2022-09-04 DIAGNOSIS — H43393 Other vitreous opacities, bilateral: Secondary | ICD-10-CM | POA: Diagnosis not present

## 2022-09-05 NOTE — Progress Notes (Unsigned)
HPI: [name] is a 73 y.o. female, who is here today for chronic disease management.  Last seen on 03/13/22. Since her last visit she has adjusted her diet by decreasing carbohydrate intake, eating fewer meals, and opting for lighter breakfasts. She continues her routine of walking two or three times per week and expresses a desire to increase physical activity once the weather becomes more favorable.  Hypertension:  Medications:HCTZ 12.5 mg daily and Amlodipine-Valsartan 10-160 mg daily. She does not check BP regularly. Negative for unusual or severe headache, exertional chest pain, dyspnea,  focal weakness, or edema.  Evaluated by her eye care provider. She experienced sudden eye discomfort after getting a foreign object in it and subsequently rubbing it. Reports that eye examination was negative,  and she was prescribed eye drops and feeling much better. HypoK+: She is on KLOR 20 meq daily. CKD III: She has not noted gross hematuria, foam in urine,or decreased urine output.  Lab Results  Component Value Date   CREATININE 1.26 (H) 03/13/2022   BUN 19 03/13/2022   NA 137 03/13/2022   K 3.5 03/13/2022   CL 100 03/13/2022   CO2 28 03/13/2022   Hyperlipidemia: Currently on Rosuvastatin 10 mg 3 times per week.  Lab Results  Component Value Date   CHOL 234 (H) 07/10/2021   HDL 69.80 07/10/2021   LDLCALC 140 (H) 07/10/2021   TRIG 121.0 07/10/2021   CHOLHDL 3 07/10/2021   Review of Systems  Constitutional: Negative for activity change, appetite change, and fever.  HENT: Negative for mouth sores and nosebleeds.  Positive for mild nasal congestion and rhinorrhea (seasonal). Respiratory: Negative for cough and wheezing.   Gastrointestinal: Negative for abdominal pain, nausea and vomiting.  Neurological: Negative for syncope, facial asymmetry. Psychiatric/Behavioral: Negative for confusion. The patient is not nervous/anxious.   Rest of ROS see pertinent positives and  negatives in HPI.  Current Outpatient Medications on File Prior to Visit  Medication Sig Dispense Refill   amLODipine-valsartan (EXFORGE) 10-160 MG tablet Take 1 tablet by mouth daily. 90 tablet 2   Calcium-Magnesium (CAL-MAG PO) Take 600 mg by mouth every morning. 1000 units of vitamin D     fluticasone (FLONASE) 50 MCG/ACT nasal spray Place 1 spray into both nostrils daily. (Patient taking differently: Place 1 spray into both nostrils daily as needed.) 16 g 6   hydrochlorothiazide (MICROZIDE) 12.5 MG capsule TAKE 1 CAPSULE BY MOUTH ONCE DAILY IN THE MORNING 90 capsule 2   potassium chloride SA (KLOR-CON M20) 20 MEQ tablet Take 1 tablet (20 mEq total) by mouth daily. 90 tablet 2   rosuvastatin (CRESTOR) 10 MG tablet TAKE 1 TABLET BY MOUTH 3 TIMES PER WEEK 30 tablet 3   No current facility-administered medications on file prior to visit.   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   Cyst (solitary) of breast    L breast cyst, mamogram in September "unchanged"   Gout    Hyperlipidemia    Hypertension    Kidney cysts    Liver cyst    No Known Allergies  Social History   Socioeconomic History   Marital status: Divorced    Spouse name: Not on file   Number of children: Not on file   Years of education: Not on file   Highest education level: Some college,  no degree  Occupational History   Not on file  Tobacco Use   Smoking status: Never   Smokeless tobacco: Never  Vaping Use   Vaping Use: Never used  Substance and Sexual Activity   Alcohol use: No   Drug use: No   Sexual activity: Not on file  Other Topics Concern   Not on file  Social History Narrative   Not on file   Social Determinants of Health   Financial Resource Strain: Low Risk  (03/09/2022)   Overall Financial Resource Strain (CARDIA)    Difficulty of Paying Living Expenses: Not very hard  Food Insecurity: No  Food Insecurity (03/09/2022)   Hunger Vital Sign    Worried About Running Out of Food in the Last Year: Never true    Ran Out of Food in the Last Year: Never true  Transportation Needs: No Transportation Needs (03/09/2022)   PRAPARE - Hydrologist (Medical): No    Lack of Transportation (Non-Medical): No  Physical Activity: Sufficiently Active (03/09/2022)   Exercise Vital Sign    Days of Exercise per Week: 5 days    Minutes of Exercise per Session: 70 min  Stress: No Stress Concern Present (03/09/2022)   [place]    Feeling of Stress : Only a little  Social Connections: Moderately Integrated (03/09/2022)   Social Connection and Isolation Panel [NHANES]    Frequency of Communication with Friends and Family: More than three times a week    Frequency of Social Gatherings with Friends and Family: More than three times a week    Attends Religious Services: More than 4 times per year    Active Member of Clubs or Organizations: Yes    Attends Archivist Meetings: More than 4 times per year    Marital Status: Divorced   Vitals:   09/07/22 0845  BP: 128/70  Pulse: 96  Resp: 16  Temp: 97.8 F (36.6 C)  SpO2: 98%   Wt Readings from Last 3 Encounters:  09/07/22 185 lb 8 oz (84.1 kg)  03/13/22 196 lb (88.9 kg)  11/27/21 190 lb (86.2 kg)   Body mass index is 35.05 kg/m.  Physical Exam Vitals and nursing note reviewed.  Constitutional:      General: She is not in acute distress.    Appearance: She is well-developed.  HENT:     Head: Normocephalic and atraumatic.     Mouth/Throat:     Mouth: Mucous membranes are moist.  Eyes:     Conjunctiva/sclera: Conjunctivae normal.     Pupils: Pupils are equal, round, and reactive to light.  Cardiovascular:     Rate and Rhythm: Normal rate and regular rhythm.     Pulses:          Dorsalis pedis pulses are 2+ on the right side and 2+ on  the left side.     Heart sounds: No murmur heard.    Comments: Trace pitting LE edema, bilateral. Pulmonary:     Effort: Pulmonary effort is normal.     Breath sounds: Normal breath sounds. No wheezing or rales.  Chest:     Chest wall: No tenderness.  Abdominal:     Palpations: Abdomen is soft. There is no mass.     Tenderness: There is no abdominal tenderness.  Musculoskeletal:     Cervical back: Neck supple.  Lymphadenopathy:     Cervical: No cervical adenopathy.  Neurological:  General: No focal deficit present.     Mental Status: She is alert and oriented to person, place, and time.     Cranial Nerves: No cranial nerve deficit.     Coordination: Coordination normal.   ASSESSMENT AND PLAN:  Ms.Oleta was seen today for follow-up.  Diagnoses and all orders for this visit: Orders Placed This Encounter  Procedures   Basic metabolic panel   VITAMIN D 25 Hydroxy (Vit-D Deficiency, Fractures)   Microalbumin / creatinine urine ratio   Lipid panel   Lab Results  Component Value Date   CHOL 208 (H) 09/07/2022   HDL 71.80 09/07/2022   LDLCALC 105 (H) 09/07/2022   TRIG 155.0 (H) 09/07/2022   CHOLHDL 3 09/07/2022   Lab Results  Component Value Date   MICROALBUR <0.7 09/07/2022   Lab Results  Component Value Date   MICROALBUR <0.7 09/07/2022   Essential hypertension, benign Assessment & Plan: Problem is well controlled. Continue current management: HCTZ 12.5 mg and Amlodipine-Valsartan 10-160 mg daily as well as low salt diet to continue. Eye exam current. F/U in 6 months.  Orders: -     Basic metabolic panel; Future  Hyperlipidemia, unspecified hyperlipidemia type Assessment & Plan: Continue Crestor 10 mg 3 times per week and low-fat diet. She has tried different statins and they all have caused myalgias arthralgias. Further recommendations according to FLP results.  Orders: -     Lipid panel; Future  Stage 3a chronic kidney disease (HCC) Assessment &  Plan: Problem has been stable,Cr 1.0-1.1 and e GFR 50's. Continue adequate hydration and BP control and well as low-salt diet and avoidance of NSAIDs.  Orders: -     VITAMIN D 25 Hydroxy (Vit-D Deficiency, Fractures); Future -     Microalbumin / creatinine urine ratio; Future  Hypokalemia Assessment & Plan: Continue KLOR 20 meq daily. Last K+ 3.5 in 02/2022. She is on HCTZ 12.5 mg daily,side effects discussed. Further recommendation will be given according to lab results.   Return in about 6 months (around 03/10/2023) for chronic problems.  Alfonza Toft G. [place], MD  [facility]. [place] office.

## 2022-09-07 ENCOUNTER — Encounter: Payer: Self-pay | Admitting: Family Medicine

## 2022-09-07 ENCOUNTER — Ambulatory Visit (INDEPENDENT_AMBULATORY_CARE_PROVIDER_SITE_OTHER): Payer: Medicare Other | Admitting: Family Medicine

## 2022-09-07 VITALS — BP 128/70 | HR 96 | Temp 97.8°F | Resp 16 | Ht 61.0 in | Wt 185.5 lb

## 2022-09-07 DIAGNOSIS — E876 Hypokalemia: Secondary | ICD-10-CM | POA: Diagnosis not present

## 2022-09-07 DIAGNOSIS — I10 Essential (primary) hypertension: Secondary | ICD-10-CM | POA: Diagnosis not present

## 2022-09-07 DIAGNOSIS — E785 Hyperlipidemia, unspecified: Secondary | ICD-10-CM

## 2022-09-07 DIAGNOSIS — N1831 Chronic kidney disease, stage 3a: Secondary | ICD-10-CM | POA: Diagnosis not present

## 2022-09-07 LAB — MICROALBUMIN / CREATININE URINE RATIO
Creatinine,U: 23.6 mg/dL
Microalb Creat Ratio: 3 mg/g (ref 0.0–30.0)
Microalb, Ur: <0.7 mg/dL (ref 0.0–1.9)

## 2022-09-07 LAB — VITAMIN D 25 HYDROXY (VIT D DEFICIENCY, FRACTURES): VITD: 73.57 ng/mL (ref 30.00–100.00)

## 2022-09-07 LAB — BASIC METABOLIC PANEL
BUN: 15 mg/dL (ref 6–23)
CO2: 29 mEq/L (ref 19–32)
Calcium: 10.2 mg/dL (ref 8.4–10.5)
Chloride: 100 mEq/L (ref 96–112)
Creatinine, Ser: 1.12 mg/dL (ref 0.40–1.20)
GFR: 48.89 mL/min — ABNORMAL LOW (ref >60.00)
Glucose, Bld: 92 mg/dL (ref 70–99)
Potassium: 3.5 mEq/L (ref 3.5–5.1)
Sodium: 139 mEq/L (ref 135–145)

## 2022-09-07 LAB — LIPID PANEL
Cholesterol: 208 mg/dL — ABNORMAL HIGH (ref 0–200)
HDL: 71.8 mg/dL (ref >39.00)
LDL Cholesterol: 105 mg/dL — ABNORMAL HIGH (ref 0–99)
NonHDL: 136
Total CHOL/HDL Ratio: 3
Triglycerides: 155 mg/dL — ABNORMAL HIGH (ref 0.0–149.0)
VLDL: 31 mg/dL (ref 0.0–40.0)

## 2022-09-07 NOTE — Assessment & Plan Note (Signed)
Problem has been stable,Cr 1.0-1.1 and e GFR 50's. Continue adequate hydration and BP control and well as low-salt diet and avoidance of NSAIDs.

## 2022-09-07 NOTE — Assessment & Plan Note (Signed)
Continue Crestor 10 mg 3 times per week and low-fat diet. She has tried different statins and they all have caused myalgias arthralgias. Further recommendations according to FLP results.

## 2022-09-07 NOTE — Assessment & Plan Note (Signed)
Problem is well controlled. Continue current management: HCTZ 12.5 mg and Amlodipine-Valsartan 10-160 mg daily as well as low salt diet to continue. Eye exam current. F/U in 6 months.

## 2022-09-07 NOTE — Patient Instructions (Signed)
A few things to remember from today's visit:  Essential hypertension, benign - Plan: Basic metabolic panel  Hyperlipidemia, unspecified hyperlipidemia type - Plan: Lipid panel  Stage 3a chronic kidney disease ([place]), Chronic - Plan: VITAMIN D 25 Hydroxy (Vit-D Deficiency, Fractures), Microalbumin / creatinine urine ratio  No changes today. You lost about 11 Lb since your last visit!! Continue the good work.  If you need refills for medications you take chronically, please call your pharmacy. Do not use My Chart to request refills or for acute issues that need immediate attention. If you send a my chart message, it may take a few days to be addressed, specially if I am not in the office.  Please be sure medication list is accurate. If a new problem present, please set up appointment sooner than planned today.

## 2022-09-09 MED ORDER — POTASSIUM CHLORIDE CRYS ER 20 MEQ PO TBCR
20.0000 meq | EXTENDED_RELEASE_TABLET | Freq: Every day | ORAL | 2 refills | Status: DC
Start: 2022-09-09 — End: 2023-06-24

## 2022-09-09 MED ORDER — AMLODIPINE BESYLATE-VALSARTAN 10-160 MG PO TABS: 1.0000 | ORAL_TABLET | Freq: Every day | ORAL | 2 refills | Status: DC

## 2022-09-09 NOTE — Assessment & Plan Note (Signed)
Continue KLOR 20 meq daily. Last K+ 3.5 in 02/2022. She is on HCTZ 12.5 mg daily,side effects discussed. Further recommendation will be given according to lab results.

## 2022-11-15 DIAGNOSIS — H5202 Hypermetropia, left eye: Secondary | ICD-10-CM | POA: Diagnosis not present

## 2022-11-15 DIAGNOSIS — H2513 Age-related nuclear cataract, bilateral: Secondary | ICD-10-CM | POA: Diagnosis not present

## 2022-11-15 DIAGNOSIS — H35033 Hypertensive retinopathy, bilateral: Secondary | ICD-10-CM | POA: Diagnosis not present

## 2022-11-15 DIAGNOSIS — H53143 Visual discomfort, bilateral: Secondary | ICD-10-CM | POA: Diagnosis not present

## 2022-11-15 DIAGNOSIS — H1045 Other chronic allergic conjunctivitis: Secondary | ICD-10-CM | POA: Diagnosis not present

## 2022-11-15 DIAGNOSIS — H52222 Regular astigmatism, left eye: Secondary | ICD-10-CM | POA: Diagnosis not present

## 2022-11-15 DIAGNOSIS — I10 Essential (primary) hypertension: Secondary | ICD-10-CM | POA: Diagnosis not present

## 2022-11-15 DIAGNOSIS — H524 Presbyopia: Secondary | ICD-10-CM | POA: Diagnosis not present

## 2022-11-30 ENCOUNTER — Ambulatory Visit (INDEPENDENT_AMBULATORY_CARE_PROVIDER_SITE_OTHER): Payer: Medicare Other

## 2022-11-30 VITALS — Ht 61.0 in | Wt 170.0 lb

## 2022-11-30 DIAGNOSIS — Z0000 Encounter for general adult medical examination without abnormal findings: Secondary | ICD-10-CM

## 2022-11-30 NOTE — Progress Notes (Signed)
Subjective:   Kristen Wolf is a 72 y.o. female who presents for Medicare Annual (Subsequent) preventive examination.  Review of Systems    Virtual Visit via Telephone Note  I connected with  Kristen Wolf on 11/30/22 at  8:15 AM EDT by telephone and verified that I am speaking with the correct person using two identifiers.  Location: Patient: Home Provider: Office Persons participating in the virtual visit: patient/Nurse Health Advisor   I discussed the limitations, risks, security and privacy concerns of performing an evaluation and management service by telephone and the availability of in person appointments. The patient expressed understanding and agreed to proceed.  Interactive audio and video telecommunications were attempted between this nurse and patient, however failed, due to patient having technical difficulties OR patient did not have access to video capability.  We continued and completed visit with audio only.  Some vital signs may be absent or patient reported.   [name], LPN  Cardiac Risk Factors include: advanced age (>40men, >45 women);hypertension     Objective:    Today's Vitals   11/30/22 0821  Weight: 170 lb (77.1 kg)  Height: 5\' 1"  (1.549 m)   Body mass index is 32.12 kg/m.     11/30/2022    8:28 AM 11/27/2021    8:29 AM 11/25/2020    8:26 AM 02/05/2018    8:20 AM 01/05/2017    3:35 AM 05/15/2016   12:51 PM 04/27/2016    2:54 PM  Advanced Directives  Does Patient Have a Medical Advance Directive? No No Yes No No No No  Type of Surveyor, minerals;Living will      Copy of Healthcare Power of Attorney in Chart?   No - copy requested      Would patient like information on creating a medical advance directive? No - Patient declined No - Patient declined   No - Patient declined      Current Medications (verified) Outpatient Encounter Medications as of 11/30/2022  Medication Sig    amLODipine-valsartan (EXFORGE) 10-160 MG tablet Take 1 tablet by mouth daily.   Calcium-Magnesium (CAL-MAG PO) Take 600 mg by mouth every morning. 1000 units of vitamin D   fluticasone (FLONASE) 50 MCG/ACT nasal spray Place 1 spray into both nostrils daily. (Patient taking differently: Place 1 spray into both nostrils daily as needed.)   hydrochlorothiazide (MICROZIDE) 12.5 MG capsule TAKE 1 CAPSULE BY MOUTH ONCE DAILY IN THE MORNING   potassium chloride SA (KLOR-CON M20) 20 MEQ tablet Take 1 tablet (20 mEq total) by mouth daily.   rosuvastatin (CRESTOR) 10 MG tablet TAKE 1 TABLET BY MOUTH 3 TIMES PER WEEK   No facility-administered encounter medications on file as of 11/30/2022.    Allergies (verified) Patient has no known allergies.   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   Cyst (solitary) of breast    L breast cyst, mamogram in September "unchanged"   Gout    Hyperlipidemia    Hypertension    Kidney cysts    Liver cyst    Past Surgical History:  Procedure Laterality Date   ABDOMINAL HYSTERECTOMY     1993   APPENDECTOMY     1980   COLONOSCOPY  03/2011   Hx rectal cancer   PARATHYROIDECTOMY  2005   PARATHYROIDECTOMY     2006   POLYPECTOMY     TUBAL LIGATION     1980   TUBAL LIGATION     Family History  Problem Relation Age of Onset   Asthma Mother    Hypertension Father    Multiple myeloma Sister    Colon cancer Neg Hx    Esophageal cancer Neg Hx    Stomach cancer Neg Hx    Social History   Socioeconomic History   Marital status: Divorced    Spouse name: Not on file   Number of children: Not on file   Years of education: Not on file   Highest education level: Some college, no degree  Occupational History   Not on file  Tobacco Use   Smoking status: Never   Smokeless tobacco: Never  Vaping Use   Vaping Use: Never used  Substance and  Sexual Activity   Alcohol use: No   Drug use: No   Sexual activity: Not on file  Other Topics Concern   Not on file  Social History Narrative   Not on file   Social Determinants of Health   Financial Resource Strain: Low Risk  (11/30/2022)   Overall Financial Resource Strain (CARDIA)    Difficulty of Paying Living Expenses: Not hard at all  Food Insecurity: No Food Insecurity (11/30/2022)   Hunger Vital Sign    Worried About Running Out of Food in the Last Year: Never true    Ran Out of Food in the Last Year: Never true  Transportation Needs: No Transportation Needs (11/30/2022)   PRAPARE - Administrator, Civil Service (Medical): No    Lack of Transportation (Non-Medical): No  Physical Activity: Sufficiently Active (11/30/2022)   Exercise Vital Sign    Days of Exercise per Week: 4 days    Minutes of Exercise per Session: 130 min  Stress: No Stress Concern Present (11/30/2022)   [employer] of Occupational Health - Occupational Stress Questionnaire    Feeling of Stress : Not at all  Social Connections: Moderately Integrated (11/30/2022)   Social Connection and Isolation Panel [NHANES]    Frequency of Communication with Friends and Family: More than three times a week    Frequency of Social Gatherings with Friends and Family: More than three times a week    Attends Religious Services: More than 4 times per year    Active Member of [employer] or Organizations: Yes    Attends Engineer, structural: More than 4 times per year    Marital Status: Divorced    Tobacco Counseling Counseling given: Not Answered   Clinical Intake:  Pre-visit preparation completed: Yes  Pain : No/denies pain     BMI - recorded: 32.12 Nutritional Status: BMI > 30  Obese Nutritional Risks: None Diabetes: No  How often do you need to have someone help you when you read instructions, pamphlets, or other written materials from your doctor or pharmacy?: 1 - Never  Diabetic?   No  Interpreter Needed?: No  Information entered by :: Theresa Mulligan LPN   Activities of Daily Living    11/30/2022    8:26 AM 11/23/2022   12:39 PM  In your present state of health, do you have any difficulty performing the following activities:  Hearing? 0 0  Vision? 0 0  Difficulty concentrating or making decisions? 0 0  Walking or climbing stairs? 0 0  Dressing or bathing? 0 0  Doing errands, shopping?  0 0  Preparing Food and eating ? N N  Using the Toilet? N N  In the past six months, have you accidently leaked urine? N N  Do you have problems with loss of bowel control? N N  Managing your Medications? N N  Managing your Finances? N N  Housekeeping or managing your Housekeeping? N N    Patient Care Team: [place], Betty G, MD as PCP - General (Family Medicine) Verner Chol, [facility] (Inactive) as Pharmacist (Pharmacist)  Indicate any recent Medical Services you may have received from other than [facility] providers in the past year (date may be approximate).     Assessment:   This is a routine wellness examination for Tineka.  Hearing/Vision screen Hearing Screening - Comments:: Denies hearing difficulties   Vision Screening - Comments:: Wears rx glasses - up to date with routine eye exams with  [facility]  Dietary issues and exercise activities discussed: Exercise limited by: None identified   Goals Addressed               This Visit's Progress     Lose weight (pt-stated)         Depression Screen    11/30/2022    8:25 AM 09/07/2022    8:52 AM 03/13/2022    8:52 AM 11/27/2021    8:22 AM 07/10/2021    7:58 AM 11/25/2020    8:27 AM 11/25/2020    8:23 AM  PHQ 2/9 Scores  PHQ - 2 Score 0 0 0 0 0 0 0    Fall Risk    11/30/2022    8:27 AM 11/23/2022   12:39 PM 09/07/2022    8:52 AM 03/13/2022    8:51 AM 03/09/2022    2:03 PM  Fall Risk   Falls in the past year? 0 0 0 0 0  Number falls in past yr: 0 0 0 0   Injury with Fall? 0 0 0 0   Risk for fall due to :  No Fall Risks  No Fall Risks No Fall Risks   Follow up Falls prevention discussed  Falls evaluation completed Falls evaluation completed     FALL RISK PREVENTION PERTAINING TO THE HOME:  Any stairs in or around the home? Yes  If so, are there any without handrails? No  Home free of loose throw rugs in walkways, pet beds, electrical cords, etc? Yes  Adequate lighting in your home to reduce risk of falls? Yes   ASSISTIVE DEVICES UTILIZED TO PREVENT FALLS:  Life alert? No  Use of a cane, walker or w/c? No  Grab bars in the bathroom? Yes  Shower chair or bench in shower? No  Elevated toilet seat or a handicapped toilet? No   TIMED UP AND GO:  Was the test performed? No . Audio Visit   Cognitive Function:        11/30/2022    8:28 AM 11/27/2021    8:29 AM  6CIT Screen  What Year? 0 points 0 points  What month? 0 points 0 points  What time? 0 points 0 points  Count back from 20 0 points 0 points  Months in reverse 0 points 0 points  Repeat phrase 0 points 0 points  Total Score 0 points 0 points    Immunizations Immunization History  Administered Date(s) Administered   Covid-19, Mrna,Vaccine(Spikevax)84yrs and older 07/09/2022   Fluad Quad(high Dose 65+) 02/29/2020, 02/08/2022   Influenza, High Dose Seasonal PF 04/22/2018, 04/14/2019, 03/20/2021  PFIZER Comirnaty(Gray Top)Covid-19 Tri-Sucrose Vaccine 01/18/2021   PFIZER(Purple Top)SARS-COV-2 Vaccination 08/09/2019, 09/02/2019, 04/07/2020, 06/05/2021   PNEUMOCOCCAL CONJUGATE-20 09/21/2020   Pfizer Covid-19 Vaccine Bivalent Booster 6yrs & up 06/05/2021   Pneumococcal Conjugate-13 08/16/2014   Pneumococcal Polysaccharide-23 05/01/2017   Tdap 01/15/2007    TDAP status: Due, Education has been provided regarding the importance of this vaccine. Advised may receive this vaccine at local pharmacy or Health Dept. Aware to provide a copy of the vaccination record if obtained from local pharmacy or Health Dept. Verbalized  acceptance and understanding.  Flu Vaccine status: Up to date  Pneumococcal vaccine status: Up to date  Covid-19 vaccine status: Completed vaccines  Qualifies for Shingles Vaccine? Yes   Zostavax completed No   Shingrix Completed?: No.    Education has been provided regarding the importance of this vaccine. Patient has been advised to call insurance company to determine out of pocket expense if they have not yet received this vaccine. Advised may also receive vaccine at local pharmacy or Health Dept. Verbalized acceptance and understanding.  Screening Tests Health Maintenance  Topic Date Due   Colonoscopy  05/15/2026 (Originally 05/15/2021)   MAMMOGRAM  12/14/2022   INFLUENZA VACCINE  01/17/2023   Medicare Annual Wellness (AWV)  11/30/2023   Pneumonia Vaccine 6+ Years old  Completed   DEXA SCAN  Completed   COVID-19 Vaccine  Completed   Hepatitis C Screening  Completed   HPV VACCINES  Aged Out   DTaP/Tdap/Td  Discontinued   Zoster Vaccines- Shingrix  Discontinued    Health Maintenance  There are no preventive care reminders to display for this patient.   Colorectal cancer screening: Referral to GI placed Deferred. Pt aware the office will call re: appt.  Mammogram status: Completed 12/13/21. Repeat every year  Bone Density status: Completed 05/24/21. Results reflect: Bone density results: OSTEOPOROSIS. Repeat every   years.  Lung Cancer Screening: (Low Dose CT Chest recommended if Age 81-80 years, 30 pack-year currently smoking OR have quit w/in 15years.) does not qualify.     Additional Screening:  Hepatitis C Screening: does qualify; Completed 01/06/21  Vision Screening: Recommended annual ophthalmology exams for early detection of glaucoma and other disorders of the eye. Is the patient up to date with their annual eye exam?  Yes  Who is the provider or what is the name of the office in which the patient attends annual eye exams? [facility] If pt is not  established with a provider, would they like to be referred to a provider to establish care? No .   Dental Screening: Recommended annual dental exams for proper oral hygiene  Community Resource Referral / Chronic Care Management:  CRR required this visit?  No   CCM required this visit?  No      Plan:     I have personally reviewed and noted the following in the patient's chart:   Medical and social history Use of alcohol, tobacco or illicit drugs  Current medications and supplements including opioid prescriptions. Patient is not currently taking opioid prescriptions. Functional ability and status Nutritional status Physical activity Advanced directives List of other physicians Hospitalizations, surgeries, and ER visits in previous 12 months Vitals Screenings to include cognitive, depression, and falls Referrals and appointments  In addition, I have reviewed and discussed with patient certain preventive protocols, quality metrics, and best practice recommendations. A written personalized care plan for preventive services as well as general preventive health recommendations were provided to patient.     Meriam Sprague  Kandis Fantasia, LPN   1/61/0960   Nurse Notes: None

## 2022-11-30 NOTE — Patient Instructions (Addendum)
Kristen Wolf , Thank you for taking time to come for your Medicare Wellness Visit. I appreciate your ongoing commitment to your health goals. Please review the following plan we discussed and let me know if I can assist you in the future.   These are the goals we discussed:  Goals       DIET - EAT MORE FRUITS AND VEGETABLES      Food diary. Try healthier snacks Continue daily walk.      Increase physical activity (pt-stated)      Lose weight (pt-stated)        This is a list of the screening recommended for you and due dates:  Health Maintenance  Topic Date Due   Colon Cancer Screening  05/15/2026*   Mammogram  12/14/2022   Flu Shot  01/17/2023   Medicare Annual Wellness Visit  11/30/2023   Pneumonia Vaccine  Completed   DEXA scan (bone density measurement)  Completed   COVID-19 Vaccine  Completed   Hepatitis C Screening  Completed   HPV Vaccine  Aged Out   DTaP/Tdap/Td vaccine  Discontinued   Zoster (Shingles) Vaccine  Discontinued  *Topic was postponed. The date shown is not the original due date.    Advanced directives: Advance directive discussed with you today. Even though you declined this today, please call our office should you change your mind, and we can give you the proper paperwork for you to fill out.   Conditions/risks identified: None  Next appointment: Follow up in one year for your annual wellness visit     Preventive Care 65 Years and Older, Female Preventive care refers to lifestyle choices and visits with your health care provider that can promote health and wellness. What does preventive care include? A yearly physical exam. This is also called an annual well check. Dental exams once or twice a year. Routine eye exams. Ask your health care provider how often you should have your eyes checked. Personal lifestyle choices, including: Daily care of your teeth and gums. Regular physical activity. Eating a healthy diet. Avoiding tobacco and drug  use. Limiting alcohol use. Practicing safe sex. Taking low-dose aspirin every day. Taking vitamin and mineral supplements as recommended by your health care provider. What happens during an annual well check? The services and screenings done by your health care provider during your annual well check will depend on your age, overall health, lifestyle risk factors, and family history of disease. Counseling  Your health care provider may ask you questions about your: Alcohol use. Tobacco use. Drug use. Emotional well-being. Home and relationship well-being. Sexual activity. Eating habits. History of falls. Memory and ability to understand (cognition). Work and work Astronomer. Reproductive health. Screening  You may have the following tests or measurements: Height, weight, and BMI. Blood pressure. Lipid and cholesterol levels. These may be checked every 5 years, or more frequently if you are over 50 years old. Skin check. Lung cancer screening. You may have this screening every year starting at age 45 if you have a 30-pack-year history of smoking and currently smoke or have quit within the past 15 years. Fecal occult blood test (FOBT) of the stool. You may have this test every year starting at age 43. Flexible sigmoidoscopy or colonoscopy. You may have a sigmoidoscopy every 5 years or a colonoscopy every 10 years starting at age 80. Hepatitis C blood test. Hepatitis B blood test. Sexually transmitted disease (STD) testing. Diabetes screening. This is done by checking your blood sugar (  glucose) after you have not eaten for a while (fasting). You may have this done every 1-3 years. Bone density scan. This is done to screen for osteoporosis. You may have this done starting at age 64. Mammogram. This may be done every 1-2 years. Talk to your health care provider about how often you should have regular mammograms. Talk with your health care provider about your test results, treatment  options, and if necessary, the need for more tests. Vaccines  Your health care provider may recommend certain vaccines, such as: Influenza vaccine. This is recommended every year. Tetanus, diphtheria, and acellular pertussis (Tdap, Td) vaccine. You may need a Td booster every 10 years. Zoster vaccine. You may need this after age 50. Pneumococcal 13-valent conjugate (PCV13) vaccine. One dose is recommended after age 11. Pneumococcal polysaccharide (PPSV23) vaccine. One dose is recommended after age 49. Talk to your health care provider about which screenings and vaccines you need and how often you need them. This information is not intended to replace advice given to you by your health care provider. Make sure you discuss any questions you have with your health care provider. Document Released: 07/01/2015 Document Revised: 02/22/2016 Document Reviewed: 04/05/2015 Elsevier Interactive Patient Education  2017 [place] Prevention in the Home Falls can cause injuries. They can happen to people of all ages. There are many things you can do to make your home safe and to help prevent falls. What can I do on the outside of my home? Regularly fix the edges of walkways and driveways and fix any cracks. Remove anything that might make you trip as you walk through a door, such as a raised step or threshold. Trim any bushes or trees on the path to your home. Use bright outdoor lighting. Clear any walking paths of anything that might make someone trip, such as rocks or tools. Regularly check to see if handrails are loose or broken. Make sure that both sides of any steps have handrails. Any raised decks and porches should have guardrails on the edges. Have any leaves, snow, or ice cleared regularly. Use sand or salt on walking paths during winter. Clean up any spills in your garage right away. This includes oil or grease spills. What can I do in the bathroom? Use night lights. Install grab  bars by the toilet and in the tub and shower. Do not use towel bars as grab bars. Use non-skid mats or decals in the tub or shower. If you need to sit down in the shower, use a plastic, non-slip stool. Keep the floor dry. Clean up any water that spills on the floor as soon as it happens. Remove soap buildup in the tub or shower regularly. Attach bath mats securely with double-sided non-slip rug tape. Do not have throw rugs and other things on the floor that can make you trip. What can I do in the bedroom? Use night lights. Make sure that you have a light by your bed that is easy to reach. Do not use any sheets or blankets that are too big for your bed. They should not hang down onto the floor. Have a firm chair that has side arms. You can use this for support while you get dressed. Do not have throw rugs and other things on the floor that can make you trip. What can I do in the kitchen? Clean up any spills right away. Avoid walking on wet floors. Keep items that you use a lot in easy-to-reach places.  If you need to reach something above you, use a strong step stool that has a grab bar. Keep electrical cords out of the way. Do not use floor polish or wax that makes floors slippery. If you must use wax, use non-skid floor wax. Do not have throw rugs and other things on the floor that can make you trip. What can I do with my stairs? Do not leave any items on the stairs. Make sure that there are handrails on both sides of the stairs and use them. Fix handrails that are broken or loose. Make sure that handrails are as long as the stairways. Check any carpeting to make sure that it is firmly attached to the stairs. Fix any carpet that is loose or worn. Avoid having throw rugs at the top or bottom of the stairs. If you do have throw rugs, attach them to the floor with carpet tape. Make sure that you have a light switch at the top of the stairs and the bottom of the stairs. If you do not have them,  ask someone to add them for you. What else can I do to help prevent falls? Wear shoes that: Do not have high heels. Have rubber bottoms. Are comfortable and fit you well. Are closed at the toe. Do not wear sandals. If you use a stepladder: Make sure that it is fully opened. Do not climb a closed stepladder. Make sure that both sides of the stepladder are locked into place. Ask someone to hold it for you, if possible. Clearly mark and make sure that you can see: Any grab bars or handrails. First and last steps. Where the edge of each step is. Use tools that help you move around (mobility aids) if they are needed. These include: Canes. Walkers. Scooters. Crutches. Turn on the lights when you go into a dark area. Replace any light bulbs as soon as they burn out. Set up your furniture so you have a clear path. Avoid moving your furniture around. If any of your floors are uneven, fix them. If there are any pets around you, be aware of where they are. Review your medicines with your doctor. Some medicines can make you feel dizzy. This can increase your chance of falling. Ask your doctor what other things that you can do to help prevent falls. This information is not intended to replace advice given to you by your health care provider. Make sure you discuss any questions you have with your health care provider. Document Released: 03/31/2009 Document Revised: 11/10/2015 Document Reviewed: 07/09/2014 Elsevier Interactive Patient Education  2017 [employer].

## 2023-01-17 ENCOUNTER — Other Ambulatory Visit: Payer: Self-pay | Admitting: Family Medicine

## 2023-01-17 DIAGNOSIS — Z1231 Encounter for screening mammogram for malignant neoplasm of breast: Secondary | ICD-10-CM

## 2023-01-21 ENCOUNTER — Ambulatory Visit
Admission: RE | Admit: 2023-01-21 | Discharge: 2023-01-21 | Disposition: A | Discharge status: Home / Self Care | Payer: Medicare Other | Source: Ambulatory Visit

## 2023-01-21 DIAGNOSIS — Z1231 Encounter for screening mammogram for malignant neoplasm of breast: Secondary | ICD-10-CM | POA: Diagnosis not present

## 2023-01-31 ENCOUNTER — Encounter (INDEPENDENT_AMBULATORY_CARE_PROVIDER_SITE_OTHER): Payer: Self-pay

## 2023-02-17 ENCOUNTER — Other Ambulatory Visit: Payer: Self-pay | Admitting: Family Medicine

## 2023-02-17 DIAGNOSIS — I10 Essential (primary) hypertension: Secondary | ICD-10-CM

## 2023-03-11 ENCOUNTER — Ambulatory Visit: Payer: Medicare Other | Admitting: Family Medicine

## 2023-03-11 ENCOUNTER — Encounter: Payer: Self-pay | Admitting: Family Medicine

## 2023-03-11 VITALS — BP 138/78 | HR 115 | Temp 98.7°F | Resp 16 | Ht 61.0 in | Wt 172.0 lb

## 2023-03-11 DIAGNOSIS — N1831 Chronic kidney disease, stage 3a: Secondary | ICD-10-CM

## 2023-03-11 DIAGNOSIS — E876 Hypokalemia: Secondary | ICD-10-CM | POA: Diagnosis not present

## 2023-03-11 DIAGNOSIS — Z23 Encounter for immunization: Secondary | ICD-10-CM | POA: Diagnosis not present

## 2023-03-11 DIAGNOSIS — I499 Cardiac arrhythmia, unspecified: Secondary | ICD-10-CM | POA: Diagnosis not present

## 2023-03-11 DIAGNOSIS — I10 Essential (primary) hypertension: Secondary | ICD-10-CM | POA: Diagnosis not present

## 2023-03-11 LAB — BASIC METABOLIC PANEL
BUN: 14 mg/dL (ref 6–23)
CO2: 24 mEq/L (ref 19–32)
Calcium: 10.4 mg/dL (ref 8.4–10.5)
Chloride: 99 mEq/L (ref 96–112)
Creatinine, Ser: 1.09 mg/dL (ref 0.40–1.20)
GFR: 50.33 mL/min — ABNORMAL LOW (ref >60.00)
Glucose, Bld: 102 mg/dL — ABNORMAL HIGH (ref 70–99)
Potassium: 3.2 mEq/L — ABNORMAL LOW (ref 3.5–5.1)
Sodium: 136 mEq/L (ref 135–145)

## 2023-03-11 NOTE — Assessment & Plan Note (Signed)
Problem has been stable, last Cr 1.1 and e GFR 48. Stressed the importance of adequate hydration and BP control. Low salt diet and avoidance of NSAID's to continue.

## 2023-03-11 NOTE — Patient Instructions (Addendum)
A few things to remember from today's visit:  Essential hypertension, benign - Plan: Basic metabolic panel, EKG 12-Lead  Stage 3a chronic kidney disease (HCC) - Plan: Basic metabolic panel  Irregular heart rate - Plan: EKG 12-Lead  No changes today. Blood pressure goal 130/80 or less.  If you need refills for medications you take chronically, please call your pharmacy. Do not use My Chart to request refills or for acute issues that need immediate attention. If you send a my chart message, it may take a few days to be addressed, specially if I am not in the office.  Please be sure medication list is accurate. If a new problem present, please set up appointment sooner than planned today.

## 2023-03-11 NOTE — Progress Notes (Signed)
HPI: [name] is a 72 y.o. female with a PMHx significant for HTN, HLD, CKD III and polyarthralgia, who is here today for chronic disease management.  Last seen on 09/07/2022 She has seen her optometrist for floaters since her last visit.   Hypertension:  Medications: She is taking amlodipine-valsartan 10-160 mg daily and hydrochlorothiazide 12.5 mg daily. BP readings at home: She has been checking at home. She reports readings in the normal range, does not recall readings. Exercise: She has been walking almost every day.  Side effects: none Negative for unusual or severe headache, visual changes, exertional chest pain, dyspnea,  focal weakness, or edema.  Lab Results  Component Value Date   CREATININE 1.12 09/07/2022   BUN 15 09/07/2022   NA 139 09/07/2022   K 3.5 09/07/2022   CL 100 09/07/2022   CO2 29 09/07/2022   Noted mildly irregular HR today, in the past PVC's have been seen on EKG. Denies palpitations, PND,or orthopnea.  CKD III:  She has no gross hematuria, foam in urine, or decreased urine output. HypoK+ on KLOR 20 meq daily.  Review of Systems  Constitutional:  Negative for activity change, appetite change, chills and fever.  Respiratory:  Negative for cough and wheezing.   Gastrointestinal:  Negative for abdominal pain, nausea and vomiting.  Endocrine: Negative for cold intolerance and heat intolerance.  Skin:  Negative for rash.  Neurological:  Negative for syncope and facial asymmetry.  Psychiatric/Behavioral:  Negative for confusion. The patient is not nervous/anxious.   See other pertinent positives and negatives in HPI.  Current Outpatient Medications on File Prior to Visit  Medication Sig Dispense Refill   amLODipine-valsartan (EXFORGE) 10-160 MG tablet Take 1 tablet by mouth daily. 90 tablet 2   Calcium-Magnesium (CAL-MAG PO) Take 600 mg by mouth every morning. 1000 units of vitamin D     fluticasone (FLONASE) 50 MCG/ACT nasal spray  Place 1 spray into both nostrils daily. (Patient taking differently: Place 1 spray into both nostrils daily as needed.) 16 g 6   hydrochlorothiazide (MICROZIDE) 12.5 MG capsule TAKE 1 CAPSULE BY MOUTH ONCE DAILY IN THE MORNING 90 capsule 2   potassium chloride SA (KLOR-CON M20) 20 MEQ tablet Take 1 tablet (20 mEq total) by mouth daily. 90 tablet 2   rosuvastatin (CRESTOR) 10 MG tablet TAKE 1 TABLET BY MOUTH 3 TIMES PER WEEK 30 tablet 3   No current facility-administered medications on file prior to visit.   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   Cyst (solitary) of breast    L breast cyst, mamogram in September "unchanged"   Gout    Hyperlipidemia    Hypertension    Kidney cysts    Liver cyst    No Known Allergies  Social History   Socioeconomic History   Marital status: Divorced    Spouse name: Not on file   Number of children: Not on file   Years of education: Not on file   Highest education level: Some college, no degree  Occupational History   Not on file  Tobacco Use   Smoking status: Never   Smokeless tobacco: Never  Vaping Use   Vaping status: Never Used  Substance and Sexual Activity   Alcohol use: No   Drug use: No   Sexual activity: Not on file  Other Topics Concern   Not on file  Social History Narrative   Not on file   Social Determinants of Health   Financial Resource Strain: Low Risk  (03/07/2023)   Overall Financial Resource Strain (CARDIA)    Difficulty of Paying Living Expenses: Not very hard  Food Insecurity: No Food Insecurity (03/07/2023)   Hunger Vital Sign    Worried About Running Out of Food in the Last Year: Never true    Ran Out of Food in the Last Year: Never true  Transportation Needs: No Transportation Needs (03/07/2023)   PRAPARE - Administrator, Civil Service (Medical): No    Lack of Transportation (Non-Medical): No   Physical Activity: Sufficiently Active (03/07/2023)   Exercise Vital Sign    Days of Exercise per Week: 4 days    Minutes of Exercise per Session: 60 min  Stress: No Stress Concern Present (03/07/2023)   [employer] of Occupational Health - Occupational Stress Questionnaire    Feeling of Stress : Only a little  Social Connections: Moderately Integrated (03/07/2023)   Social Connection and Isolation Panel [NHANES]    Frequency of Communication with Friends and Family: More than three times a week    Frequency of Social Gatherings with Friends and Family: Three times a week    Attends Religious Services: More than 4 times per year    Active Member of Clubs or Organizations: Yes    Attends Banker Meetings: More than 4 times per year    Marital Status: Divorced   Today's Vitals   03/11/23 0802 03/11/23 0844  BP:  138/78  Pulse: 100 (!) 115  Resp:  16  Temp:  98.7 F (37.1 C)  TempSrc:  Oral  SpO2:  99%  Weight:  172 lb (78 kg)  Height:  5\' 1"  (1.549 m)   Body mass index is 32.5 kg/m.  Physical Exam Vitals and nursing note reviewed.  Constitutional:      General: She is not in acute distress.    Appearance: She is well-developed.  HENT:     Head: Normocephalic and atraumatic.     Mouth/Throat:     Mouth: Mucous membranes are moist.  Eyes:     Conjunctiva/sclera: Conjunctivae normal.  Cardiovascular:     Rate and Rhythm: Normal rate. Rhythm irregular. Occasional Extrasystoles (x 1-2) are present.    Pulses:          Dorsalis pedis pulses are 2+ on the right side and 2+ on the left side.     Heart sounds: No murmur heard. Pulmonary:     Effort: Pulmonary effort is normal. No respiratory distress.     Breath sounds: Normal breath sounds.  Abdominal:     Palpations: Abdomen is soft. There is no hepatomegaly or mass.     Tenderness: There is no abdominal tenderness.  Lymphadenopathy:     Cervical: No cervical adenopathy.  Skin:    General: Skin is  warm.     Findings: No erythema or rash.  Neurological:     General: No focal deficit present.     Mental Status: She is alert and oriented to person, place, and time.     Cranial Nerves: No cranial nerve deficit.     Gait: Gait normal.  Psychiatric:        Mood and Affect: Mood and affect normal.   ASSESSMENT AND PLAN:  Kristen Wolf was seen today for chronic disease follow up.   Orders Placed  This Encounter  Procedures   Basic metabolic panel   EKG 12-Lead   Lab Results  Component Value Date   NA 136 03/11/2023   CL 99 03/11/2023   K 3.2 (L) 03/11/2023   CO2 24 03/11/2023   BUN 14 03/11/2023   CREATININE 1.09 03/11/2023   GFR 50.33 (L) 03/11/2023   CALCIUM 10.4 03/11/2023   ALBUMIN 4.2 03/13/2022   GLUCOSE 102 (H) 03/11/2023   Essential hypertension, benign Assessment & Plan: Problem is well controlled. Continue HCTZ 12.5 mg and Amlodipine-Valsartan 10-160 mg daily. Low salt diet to continue. Continue monitoring BP regularly. Eye exam is current. F/U in 6 months.  Orders: -     Basic metabolic panel; Future -     EKG 12-Lead  Stage 3a chronic kidney disease (HCC) Assessment & Plan: Problem has been stable, last Cr 1.1 and e GFR 48. Stressed the importance of adequate hydration and BP control. Low salt diet and avoidance of NSAID's to continue.  Orders: -     Basic metabolic panel; Future  Irregular heart rate Assessment & Plan: Asymptomatic. EKG today NSR, normal axis and intervals, unspecific T wave abnormalities. When compared with EKG done in 01/2017 PVC's are not longer present, rest with no significant changes. Instructed about warning signs.  Orders: -     EKG 12-Lead  Need for influenza vaccination -     Flu Vaccine Trivalent High Dose (Fluad)  Hypokalemia Assessment & Plan: Last K+ 3.5. Continue KLOR 20 meq daily.   Return in about 6 months (around 09/08/2023) for chronic problems, Labs.  I, Suanne Marker, acting as a scribe for  Kadee Philyaw [place], MD., have documented all relevant documentation on the behalf of Kristen Burruel [place], MD, as directed by  Danylle Ouk [place], MD while in the presence of Spike Desilets [place], MD.   I, Suanne Marker, have reviewed all documentation for this visit. The documentation on 03/11/23 for the exam, diagnosis, procedures, and orders are all accurate and complete.  Kristen Doddridge G. [place], MD  [facility]. [place] office.

## 2023-03-11 NOTE — Assessment & Plan Note (Signed)
Last K+ 3.5. Continue KLOR 20 meq daily.

## 2023-03-11 NOTE — Assessment & Plan Note (Signed)
Problem is well controlled. Continue HCTZ 12.5 mg and Amlodipine-Valsartan 10-160 mg daily. Low salt diet to continue. Continue monitoring BP regularly. Eye exam is current. F/U in 6 months.

## 2023-03-11 NOTE — Assessment & Plan Note (Signed)
Asymptomatic. EKG today NSR, normal axis and intervals, unspecific T wave abnormalities. When compared with EKG done in 01/2017 PVC's are not longer present, rest with no significant changes. Instructed about warning signs.

## 2023-03-12 ENCOUNTER — Other Ambulatory Visit: Payer: Self-pay

## 2023-03-12 ENCOUNTER — Encounter: Payer: Self-pay | Admitting: Family Medicine

## 2023-03-12 MED ORDER — AMLODIPINE BESYLATE-VALSARTAN 10-320 MG PO TABS: 1.0000 | ORAL_TABLET | Freq: Every day | ORAL | 0 refills | Status: DC

## 2023-04-05 ENCOUNTER — Other Ambulatory Visit: Payer: Self-pay

## 2023-04-05 ENCOUNTER — Telehealth: Payer: Self-pay | Admitting: Family Medicine

## 2023-04-05 MED ORDER — AMLODIPINE BESYLATE-VALSARTAN 10-320 MG PO TABS: 1.0000 | ORAL_TABLET | Freq: Every day | ORAL | 1 refills | Status: DC

## 2023-04-05 NOTE — Telephone Encounter (Signed)
Pt is calling and would like to confirm which BP med should she be taking she has amLODipine-valsartan (EXFORGE) 10-160 MG tablet and pharm call her about new rx amLODipine-valsartan (EXFORGE) 10-320 MG tablet . Please confirm and also pt would like to know if she needs blood work

## 2023-04-05 NOTE — Telephone Encounter (Signed)
I called and spoke with patient. Per chart notes, she is supposed to be on the 10-320 mg. She will continue that one & come for blood work on 10/21 at 10am.

## 2023-04-08 ENCOUNTER — Other Ambulatory Visit (INDEPENDENT_AMBULATORY_CARE_PROVIDER_SITE_OTHER): Payer: Medicare Other

## 2023-04-08 DIAGNOSIS — E876 Hypokalemia: Secondary | ICD-10-CM | POA: Diagnosis not present

## 2023-04-08 LAB — POTASSIUM: Potassium: 3.5 meq/L (ref 3.5–5.1)

## 2023-04-09 ENCOUNTER — Other Ambulatory Visit: Payer: Self-pay | Admitting: Family Medicine

## 2023-04-12 LAB — ALDOSTERONE + RENIN ACTIVITY W/ RATIO
ALDO / PRA Ratio: 4.4 ratio (ref 0.9–28.9)
Aldosterone: 6 ng/dL
Renin Activity: 1.36 ng/mL/h (ref 0.25–5.82)

## 2023-06-21 ENCOUNTER — Other Ambulatory Visit: Payer: Self-pay | Admitting: Family Medicine

## 2023-06-21 DIAGNOSIS — I10 Essential (primary) hypertension: Secondary | ICD-10-CM

## 2023-06-22 ENCOUNTER — Other Ambulatory Visit: Payer: Self-pay | Admitting: Family Medicine

## 2023-06-22 DIAGNOSIS — E876 Hypokalemia: Secondary | ICD-10-CM

## 2023-09-06 NOTE — Progress Notes (Signed)
 HPI: Kristen Wolf is a 73 y.o. female with a PMHx significant for HTN, HLD, CKD III, and polyarthralgia, who is here today for chronic disease management.  Last seen on 03/11/2023  Diet: Patient says Kristen Wolf has made some improvements in her diet and is trying to avoid salt.  Exercise: Kristen Wolf has been walking daily.   Hypertension:  Medications: Currently on amlodipine-valsartan 10-320 mg daily.  Kristen Wolf doesn't check her BP very regularly at home.  Side effects: none Vision: UTD on routine vision care.  Negative for unusual or severe headache, visual changes, exertional chest pain, dyspnea, palpitations, focal weakness, or worsening edema.  Hypokalemia: Kristen Wolf is on KLOR 20 meq daily.  Lab Results  Component Value Date   NA 136 03/11/2023   CL 99 03/11/2023   K 3.5 04/08/2023   CO2 24 03/11/2023   BUN 14 03/11/2023   CREATININE 1.09 03/11/2023   GFR 50.33 (L) 03/11/2023   CALCIUM 10.4 03/11/2023   ALBUMIN 4.2 03/13/2022   GLUCOSE 102 (H) 03/11/2023   Hyperlipidemia: Currently on rosuvastatin 10 mg 3x per week.  Kristen Wolf has tried other statins, did not tolerate it well.  Lab Results  Component Value Date   CHOL 208 (H) 09/07/2022   HDL 71.80 09/07/2022   LDLCALC 105 (H) 09/07/2022   TRIG 155.0 (H) 09/07/2022   CHOLHDL 3 09/07/2022   CKD III: Kristen Wolf has had no gross hematuria or foam in her urine.  Lab Results  Component Value Date   NA 136 03/11/2023   K 3.5 04/08/2023   CREATININE 1.09 03/11/2023   GFR 50.33 (L) 03/11/2023   GLUCOSE 102 (H) 03/11/2023   Review of Systems  Constitutional:  Negative for chills and fever.  HENT:  Negative for nosebleeds and sore throat.   Respiratory:  Negative for cough and wheezing.   Gastrointestinal:  Negative for abdominal pain, nausea and vomiting.  Genitourinary:  Negative for decreased urine volume and dysuria.  Musculoskeletal:  Negative for gait problem and myalgias.  Skin:  Negative for rash.  Neurological:  Negative for  syncope and facial asymmetry.  See other pertinent positives and negatives in HPI.  Current Outpatient Medications on File Prior to Visit  Medication Sig Dispense Refill   amLODipine-valsartan (EXFORGE) 10-320 MG tablet Take 1 tablet by mouth daily. 90 tablet 1   Calcium-Magnesium (CAL-MAG PO) Take 600 mg by mouth every morning. 1000 units of vitamin D     fluticasone (FLONASE) 50 MCG/ACT nasal spray Place 1 spray into both nostrils daily. (Patient taking differently: Place 1 spray into both nostrils daily as needed.) 16 g 6   KLOR-CON M20 20 MEQ tablet TAKE 1 TABLET BY MOUTH EVERY DAY 90 tablet 2   rosuvastatin (CRESTOR) 10 MG tablet TAKE 1 TABLET BY MOUTH 3 TIMES PER WEEK 30 tablet 3   No current facility-administered medications on file prior to visit.    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   Cyst (solitary) of breast    L breast cyst, mamogram in September "unchanged"   Gout    Hyperlipidemia    Hypertension    Kidney cysts    Liver cyst    No Known Allergies  Social History   Socioeconomic History   Marital status: Divorced    Spouse name: Not on file   Number of  children: Not on file   Years of education: Not on file   Highest education level: Associate degree: occupational, Scientist, product/process development, or vocational program  Occupational History   Not on file  Tobacco Use   Smoking status: Never   Smokeless tobacco: Never  Vaping Use   Vaping status: Never Used  Substance and Sexual Activity   Alcohol use: No   Drug use: No   Sexual activity: Not on file  Other Topics Concern   Not on file  Social History Narrative   Not on file   Social Drivers of Health   Financial Resource Strain: Low Risk  (09/05/2023)   Overall Financial Resource Strain (CARDIA)    Difficulty of Paying Living Expenses: Not very hard  Food Insecurity: No Food Insecurity (09/05/2023)   Hunger  Vital Sign    Worried About Running Out of Food in the Last Year: Never true    Ran Out of Food in the Last Year: Never true  Transportation Needs: No Transportation Needs (09/05/2023)   PRAPARE - Administrator, Civil Service (Medical): No    Lack of Transportation (Non-Medical): No  Physical Activity: Sufficiently Active (09/05/2023)   Exercise Vital Sign    Days of Exercise per Week: 5 days    Minutes of Exercise per Session: 60 min  Stress: No Stress Concern Present (09/05/2023)   [employer] of Occupational Health - Occupational Stress Questionnaire    Feeling of Stress : Not at all  Social Connections: Moderately Integrated (09/05/2023)   Social Connection and Isolation Panel [NHANES]    Frequency of Communication with Friends and Family: More than three times a week    Frequency of Social Gatherings with Friends and Family: More than three times a week    Attends Religious Services: More than 4 times per year    Active Member of [employer] or Organizations: Yes    Attends Banker Meetings: More than 4 times per year    Marital Status: Divorced    Vitals:   09/09/23 0745  BP: 138/84  Pulse: 100  Resp: 16  Temp: 98.6 F (37 C)  SpO2: 99%   Body mass index is 33.14 kg/m.  Physical Exam Vitals and nursing note reviewed.  Constitutional:      General: Kristen Wolf is not in acute distress.    Appearance: Kristen Wolf is well-developed.  HENT:     Head: Normocephalic and atraumatic.     Mouth/Throat:     Mouth: Mucous membranes are moist.     Pharynx: Oropharynx is clear.  Eyes:     Conjunctiva/sclera: Conjunctivae normal.  Cardiovascular:     Rate and Rhythm: Normal rate and regular rhythm.     Pulses:          Posterior tibial pulses are 2+ on the right side and 2+ on the left side.     Heart sounds: No murmur heard. Pulmonary:     Effort: Pulmonary effort is normal. No respiratory distress.     Breath sounds: Normal breath sounds.  Abdominal:      Palpations: Abdomen is soft. There is no hepatomegaly or mass.     Tenderness: There is no abdominal tenderness.  Musculoskeletal:     Right lower leg: No edema.     Left lower leg: No edema.  Lymphadenopathy:     Cervical: No cervical adenopathy.  Skin:    General: Skin is warm.     Findings: No erythema or rash.  Neurological:  General: No focal deficit present.     Mental Status: Kristen Wolf is alert and oriented to person, place, and time.     Cranial Nerves: No cranial nerve deficit.     Gait: Gait normal.  Psychiatric:     Comments: Well groomed, good eye contact.    ASSESSMENT AND PLAN:  Kristen Wolf was seen today for chronic disease management.   Orders Placed This Encounter  Procedures   Comprehensive metabolic panel   Microalbumin / creatinine urine ratio   Lipid panel   Lab Results  Component Value Date   NA 138 09/09/2023   CL 103 09/09/2023   K 3.9 09/09/2023   CO2 27 09/09/2023   BUN 21 09/09/2023   CREATININE 1.06 09/09/2023   GFR 51.86 (L) 09/09/2023   CALCIUM 10.0 09/09/2023   ALBUMIN 4.2 09/09/2023   GLUCOSE 96 09/09/2023   Lab Results  Component Value Date   ALT 18 09/09/2023   AST 26 09/09/2023   ALKPHOS 98 09/09/2023   BILITOT 0.4 09/09/2023   Lab Results  Component Value Date   CHOL 199 09/09/2023   HDL 71.20 09/09/2023   LDLCALC 111 (H) 09/09/2023   TRIG 85.0 09/09/2023   CHOLHDL 3 09/09/2023   Lab Results  Component Value Date   MICROALBUR <0.7 09/09/2023   MICROALBUR <0.7 09/07/2022   Hyperlipidemia, unspecified hyperlipidemia type Assessment & Plan: Continue Crestor 10 mg 3 times per week and low-fat diet. Other statins have caused myalgias arthralgias.  Orders: -     Comprehensive metabolic panel; Future -     Lipid panel; Future  Stage 3a chronic kidney disease (HCC) Assessment & Plan: Problem has been stable, Cr 1.0-1.1 and e GFR high 40's-50. Continue adequate hydration, low-salt diet, and avoidance of  NSAIDs.  Orders: -     Comprehensive metabolic panel; Future -     Microalbumin / creatinine urine ratio; Future  Essential hypertension, benign Assessment & Plan: Today BP mildly elevated, Kristen Wolf is not monitoring BP at home, recommend doing so. Continue amlodipine-valsartan 10-320 mg daily and low-salt diet. Eye exam is current.  Orders: -     Comprehensive metabolic panel; Future  Hypokalemia Assessment & Plan: Continue K-Lor 20 mK daily. Aldosterone-renin studies in normal range in 03/2023. Further recommendation will be given according to BMP result.   Return in about 6 months (around 03/11/2024) for chronic problems.  I, Rolla Etienne Wierda, acting as a scribe for Darreld Hoffer [place], MD., have documented all relevant documentation on the behalf of Alexander Aument [place], MD, as directed by  Chucky Homes [place], MD while in the presence of Analia Zuk [place], MD.   I, Vitoria Conyer [place], MD, have reviewed all documentation for this visit. The documentation on 09/09/23 for the exam, diagnosis, procedures, and orders are all accurate and complete.  Wasif Simonich G. [place], MD  [facility]. [place] office.

## 2023-09-09 ENCOUNTER — Encounter: Payer: Self-pay | Admitting: Family Medicine

## 2023-09-09 ENCOUNTER — Ambulatory Visit (INDEPENDENT_AMBULATORY_CARE_PROVIDER_SITE_OTHER): Payer: Medicare Other | Admitting: Family Medicine

## 2023-09-09 VITALS — BP 138/84 | HR 100 | Temp 98.6°F | Resp 16 | Ht 61.0 in | Wt 175.4 lb

## 2023-09-09 DIAGNOSIS — N1831 Chronic kidney disease, stage 3a: Secondary | ICD-10-CM

## 2023-09-09 DIAGNOSIS — E876 Hypokalemia: Secondary | ICD-10-CM | POA: Diagnosis not present

## 2023-09-09 DIAGNOSIS — I10 Essential (primary) hypertension: Secondary | ICD-10-CM

## 2023-09-09 DIAGNOSIS — E785 Hyperlipidemia, unspecified: Secondary | ICD-10-CM

## 2023-09-09 LAB — COMPREHENSIVE METABOLIC PANEL
ALT: 18 U/L (ref 0–35)
AST: 26 U/L (ref 0–37)
Albumin: 4.2 g/dL (ref 3.5–5.2)
Alkaline Phosphatase: 98 U/L (ref 39–117)
BUN: 21 mg/dL (ref 6–23)
CO2: 27 meq/L (ref 19–32)
Calcium: 10 mg/dL (ref 8.4–10.5)
Chloride: 103 meq/L (ref 96–112)
Creatinine, Ser: 1.06 mg/dL (ref 0.40–1.20)
GFR: 51.86 mL/min — ABNORMAL LOW (ref >60.00)
Glucose, Bld: 96 mg/dL (ref 70–99)
Potassium: 3.9 meq/L (ref 3.5–5.1)
Sodium: 138 meq/L (ref 135–145)
Total Bilirubin: 0.4 mg/dL (ref 0.2–1.2)
Total Protein: 7.5 g/dL (ref 6.0–8.3)

## 2023-09-09 LAB — LIPID PANEL
Cholesterol: 199 mg/dL (ref 0–200)
HDL: 71.2 mg/dL (ref >39.00)
LDL Cholesterol: 111 mg/dL — ABNORMAL HIGH (ref 0–99)
NonHDL: 127.79
Total CHOL/HDL Ratio: 3
Triglycerides: 85 mg/dL (ref 0.0–149.0)
VLDL: 17 mg/dL (ref 0.0–40.0)

## 2023-09-09 LAB — MICROALBUMIN / CREATININE URINE RATIO
Creatinine,U: 27.9 mg/dL
Microalb Creat Ratio: UNDETERMINED mg/g (ref 0.0–30.0)
Microalb, Ur: <0.7 mg/dL

## 2023-09-09 MED ORDER — ROSUVASTATIN CALCIUM 10 MG PO TABS: ORAL_TABLET | ORAL | 3 refills | Status: AC

## 2023-09-09 MED ORDER — AMLODIPINE BESYLATE-VALSARTAN 10-320 MG PO TABS: 1.0000 | ORAL_TABLET | Freq: Every day | ORAL | 2 refills | Status: DC

## 2023-09-09 NOTE — Patient Instructions (Addendum)
 A few things to remember from today's visit:  Hyperlipidemia, unspecified hyperlipidemia type - Plan: Comprehensive metabolic panel, Lipid panel  Stage 3a chronic kidney disease (HCC), Chronic - Plan: Comprehensive metabolic panel, Microalbumin / creatinine urine ratio  Essential hypertension, benign - Plan: Comprehensive metabolic panel  Hypokalemia  No changes today. Monitor blood pressure at home.  If you need refills for medications you take chronically, please call your pharmacy. Do not use My Chart to request refills or for acute issues that need immediate attention. If you send a my chart message, it may take a few days to be addressed, specially if I am not in the office.  Please be sure medication list is accurate. If a new problem present, please set up appointment sooner than planned today.

## 2023-09-09 NOTE — Assessment & Plan Note (Signed)
 Continue K-Lor 20 mK daily. Aldosterone-renin studies in normal range in 03/2023. Further recommendation will be given according to BMP result.

## 2023-09-09 NOTE — Assessment & Plan Note (Signed)
 Problem has been stable, Cr 1.0-1.1 and e GFR high 40's-50. Continue adequate hydration, low-salt diet, and avoidance of NSAIDs.

## 2023-09-09 NOTE — Assessment & Plan Note (Signed)
 Continue Crestor 10 mg 3 times per week and low-fat diet. Other statins have caused myalgias arthralgias.

## 2023-09-09 NOTE — Assessment & Plan Note (Signed)
 Today BP mildly elevated, she is not monitoring BP at home, recommend doing so. Continue amlodipine-valsartan 10-320 mg daily and low-salt diet. Eye exam is current.

## 2023-09-10 ENCOUNTER — Encounter: Payer: Self-pay | Admitting: Family Medicine

## 2023-09-10 NOTE — Telephone Encounter (Signed)
 Pt had not stopped taking the hydrochlorothiazide as you directed by in September. She will stop it and continue the higher strength of the Exforge and let us know her BP readings in a few days.

## 2023-09-13 ENCOUNTER — Ambulatory Visit: Payer: Self-pay

## 2023-09-13 ENCOUNTER — Encounter: Payer: Self-pay | Admitting: Family Medicine

## 2023-09-13 NOTE — Telephone Encounter (Signed)
 Chief Complaint: vaginal spotting Symptoms: spotting Frequency: today Pertinent Negatives: Patient denies fever, chills, large amount of blood loss, weakness, dizziness, hemorrhoids, abscess, pain Disposition: [] ED /[] Urgent Care (no appt availability in office) / [x] Appointment(In office/virtual)/ []  [facility]/ [] Home Care/ [] Refused Recommended Disposition /[] [facility]/ []  Follow-up with PCP Additional Notes: Pt reports vaginal spotting today. Pt noticed something "brownish" on her pantyliner earlier today and believes it is blood. Pt has had a hysterectomy. Pt denies pain. Denies large amount of blood loss. Denies fever, chills, urinary symptoms, hemorrhoids, bloody bowel movements, abd pain or cramping, abscess or any pain to the vaginal area. Denies dizziness or lightheadedness. Per protocol pt scheduled for 0730 Monday. RN advised pt she needs to go to the ED if she bleeding worsens, if she is soaking through multiple pads, or if she develops dizziness to which she verbalized understanding.       Copied from CRM [id number]. Topic: Clinical - Red Word Triage >> Sep 13, 2023  4:28 PM Almira Coaster wrote: Red Word that prompted transfer to Nurse Triage: Patient is calling because she noticed vaginal bleeding, spotting not profusely but it concerns her because she no longer gets her period or has a uterus. Reason for Disposition  Postmenopausal vaginal bleeding  Answer Assessment - Initial Assessment Questions 1. AMOUNT: "Describe the bleeding that you are having." "How much bleeding is there?"    - SPOTTING: spotting, or pinkish / brownish mucous discharge; does not fill panty liner or pad    - MILD:  less than 1 pad / hour; less than patient's usual menstrual bleeding   - MODERATE: 1-2 pads / hour; 1 menstrual cup every 6 hours; small-medium blood clots (e.g., pea, grape, small coin)   - SEVERE: soaking 2 or more pads/hour for 2 or more hours; 1 menstrual cup every 2  hours; bleeding not contained by pads or continuous red blood from vagina; large blood clots (e.g., golf ball, large coin)      Spotting 2. ONSET: "When did the bleeding begin?" "Is it continuing now?"     She just noticed it today, pt states she saw something brownish looking on her panty liner when she went to urinate. Used toilet paper to press on it and get a better look. States it looked like blood. 3. MENOPAUSE: "When was your last menstrual period?"      Post-menopausal 4. ABDOMEN PAIN: "Do you have any pain?" "How bad is the pain?"  (e.g., Scale 1-10; mild, moderate, or severe)   - MILD (1-3): doesn't interfere with normal activities, abdomen soft and not tender to touch    - MODERATE (4-7): interferes with normal activities or awakens from sleep, abdomen tender to touch    - SEVERE (8-10): excruciating pain, doubled over, unable to do any normal activities      No pain 5. BLOOD THINNERS: "Do you take any blood thinners?" (e.g., Coumadin/warfarin, Pradaxa/dabigatran, aspirin)     None  6. HORMONE MEDICINES: "Are you taking any hormone medicines, prescription or OTC?" (e.g., birth control pills, estrogen)     No 7. CAUSE: "What do you think is causing the bleeding?" (e.g., recent gyn surgery, recent gyn procedure; known bleeding disorder, uterine cancer)       Does not have a boil or abscess as far as she knows. No hemorrhoids. No hematuria. No blood thinners. Taken off a diuretic and potassium. Taking amlodipine. 8. HEMODYNAMIC STATUS: "Are you weak or feeling lightheaded?" If Yes, ask: "Can  you stand and walk normally?"       No 9. OTHER SYMPTOMS: "What other symptoms are you having with the bleeding?" (e.g., back pain, burning with urination, fever)     None. States she did apply some vaginal lubricant with her finger d/t dryness after a hysterectomy - wonders if she scratched the area when applying it  Protocols used: Vaginal Bleeding - Postmenopausal-A-AH

## 2023-09-16 ENCOUNTER — Encounter: Payer: Self-pay | Admitting: Family Medicine

## 2023-09-16 ENCOUNTER — Ambulatory Visit (INDEPENDENT_AMBULATORY_CARE_PROVIDER_SITE_OTHER): Admitting: Family Medicine

## 2023-09-16 VITALS — BP 136/70 | HR 91 | Temp 98.1°F | Resp 16 | Ht 61.0 in | Wt 172.5 lb

## 2023-09-16 DIAGNOSIS — N939 Abnormal uterine and vaginal bleeding, unspecified: Secondary | ICD-10-CM

## 2023-09-16 DIAGNOSIS — I10 Essential (primary) hypertension: Secondary | ICD-10-CM | POA: Diagnosis not present

## 2023-09-16 NOTE — Progress Notes (Signed)
 ACUTE VISIT Chief Complaint  Patient presents with   Vaginal Bleeding    Spotting on Friday, hasn't happened since. Pt did insert vaginal moisture medication, was thinking maybe her nail scraped and cut and caused the spotting.    HPI: Kristen Wolf is a 73 y.o. female with a PMHx significant for HTN, HLD, CKD III, and polyarthralgia, who is here today complaining of vaginal bleeding post menopause.  Noted after inserting vaginal suppository as described above, she did not feel any pain or discomfort while doing so. Initially brownish discharge on underwear and then red blood on tissue after voiding. She underwent hysterectomy and left oophorectomy due to fibroids and ovarian cyst respectively.  Vaginal Bleeding The patient's primary symptoms include vaginal bleeding. The patient's pertinent negatives include no genital itching, genital lesions, genital odor, genital rash, pelvic pain or vaginal discharge. The current episode started in the past 7 days. The problem has been resolved. The patient is experiencing no pain. She is not pregnant. Pertinent negatives include no abdominal pain, anorexia, back pain, chills, discolored urine, dysuria, fever, flank pain, frequency, headaches, hematuria, nausea, rash, sore throat, urgency or vomiting. The vaginal bleeding is spotting. She has not been passing clots. She has not been passing tissue. Nothing aggravates the symptoms. She is not sexually active. Her past medical history is significant for a gynecological surgery.   HTN: BP mildly elevated today. Currently she is on amlodipine-valsartan 10-320 mg daily. Recently she stopped hydrochlorothiazide 12.5 mg, which was discontinued due to persistent hypokalemia. Negative for unusual headache, CP, dyspnea, palpitations, or edema.  Lab Results  Component Value Date   NA 138 09/09/2023   CL 103 09/09/2023   K 3.9 09/09/2023   CO2 27 09/09/2023   BUN 21 09/09/2023   CREATININE 1.06  09/09/2023   GFR 51.86 (L) 09/09/2023   CALCIUM 10.0 09/09/2023   ALBUMIN 4.2 09/09/2023   GLUCOSE 96 09/09/2023   Review of Systems  Constitutional:  Negative for chills and fever.  HENT:  Negative for sore throat.   Respiratory:  Negative for cough.   Gastrointestinal:  Negative for abdominal pain, anorexia, nausea and vomiting.  Genitourinary:  Positive for vaginal bleeding. Negative for dysuria, flank pain, frequency, hematuria, pelvic pain, urgency and vaginal discharge.  Musculoskeletal:  Negative for back pain.  Skin:  Negative for rash.  Neurological:  Negative for syncope, facial asymmetry and headaches.  See other pertinent positives and negatives in HPI.  Current Outpatient Medications on File Prior to Visit  Medication Sig Dispense Refill   amLODipine-valsartan (EXFORGE) 10-320 MG tablet Take 1 tablet by mouth daily. 90 tablet 2   Calcium-Magnesium (CAL-MAG PO) Take 600 mg by mouth every morning. 1000 units of vitamin D     fluticasone (FLONASE) 50 MCG/ACT nasal spray Place 1 spray into both nostrils daily. (Patient taking differently: Place 1 spray into both nostrils daily as needed.) 16 g 6   KLOR-CON M20 20 MEQ tablet TAKE 1 TABLET BY MOUTH EVERY DAY 90 tablet 2   rosuvastatin (CRESTOR) 10 MG tablet Take 1 tablet by mouth 3 times per week. 30 tablet 3   No current facility-administered medications on file prior to visit.    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   Cyst (solitary) of breast    L breast cyst, mamogram in September "  unchanged"   Gout    Hyperlipidemia    Hypertension    Kidney cysts    Liver cyst    No Known Allergies  Social History   Socioeconomic History   Marital status: Divorced    Spouse name: Not on file   Number of children: Not on file   Years of education: Not on file   Highest education level: Associate degree: occupational,  Scientist, product/process development, or vocational program  Occupational History   Not on file  Tobacco Use   Smoking status: Never   Smokeless tobacco: Never  Vaping Use   Vaping status: Never Used  Substance and Sexual Activity   Alcohol use: No   Drug use: No   Sexual activity: Not on file  Other Topics Concern   Not on file  Social History Narrative   Not on file   Social Drivers of Health   Financial Resource Strain: Low Risk  (09/05/2023)   Overall Financial Resource Strain (CARDIA)    Difficulty of Paying Living Expenses: Not very hard  Food Insecurity: No Food Insecurity (09/05/2023)   Hunger Vital Sign    Worried About Running Out of Food in the Last Year: Never true    Ran Out of Food in the Last Year: Never true  Transportation Needs: No Transportation Needs (09/05/2023)   PRAPARE - Administrator, Civil Service (Medical): No    Lack of Transportation (Non-Medical): No  Physical Activity: Sufficiently Active (09/05/2023)   Exercise Vital Sign    Days of Exercise per Week: 5 days    Minutes of Exercise per Session: 60 min  Stress: No Stress Concern Present (09/05/2023)   [employer] of Occupational Health - Occupational Stress Questionnaire    Feeling of Stress : Not at all  Social Connections: Moderately Integrated (09/05/2023)   Social Connection and Isolation Panel [NHANES]    Frequency of Communication with Friends and Family: More than three times a week    Frequency of Social Gatherings with Friends and Family: More than three times a week    Attends Religious Services: More than 4 times per year    Active Member of Clubs or Organizations: Yes    Attends Banker Meetings: More than 4 times per year    Marital Status: Divorced    Vitals:   09/16/23 0723 09/16/23 0800  BP: (!) 140/78 136/70  Pulse: 91   Resp: 16   Temp: 98.1 F (36.7 C)   SpO2: 98%    Body mass index is 32.59 kg/m.  Physical Exam Vitals and nursing note reviewed. Exam conducted  with a chaperone present.  Constitutional:      General: She is not in acute distress.    Appearance: She is well-developed.  HENT:     Head: Normocephalic and atraumatic.  Eyes:     Conjunctiva/sclera: Conjunctivae normal.  Cardiovascular:     Rate and Rhythm: Normal rate and regular rhythm.     Heart sounds: No murmur heard.    Comments: Trace pitting LE edema, bilateral. Pulmonary:     Effort: Pulmonary effort is normal. No respiratory distress.     Breath sounds: Normal breath sounds.  Abdominal:     Palpations: Abdomen is soft. There is no mass.     Tenderness: There is no abdominal tenderness.  Genitourinary:    Exam position: Lithotomy position.     Labia:        Right: No rash, tenderness or lesion.  Left: No rash, tenderness or lesion.      Vagina: No vaginal discharge, erythema, tenderness, bleeding or lesions.     Comments: Vaginal atrophic changes appreciated. Cervix absent. Skin:    General: Skin is warm.     Findings: No erythema or rash.  Neurological:     General: No focal deficit present.     Mental Status: She is alert and oriented to person, place, and time.     Gait: Gait normal.  Psychiatric:        Mood and Affect: Mood and affect normal.   ASSESSMENT AND PLAN:  Kristen Wolf was seen today for vaginal bleeding.   Vaginal bleeding We discussed possible etiologies. Examination does not suggest a serious process. ?  Mild injury while inserting suppository due to vaginal atrophic changes. For now we can continue monitoring for new symptoms, if she has another episode, gynecology referral will be recommended. Instructed about warning signs.  Essential hypertension, benign Assessment & Plan: Reporting lower BP readings at home. Continue amlodipine-valsartan 10-320 mg daily and low salt diet. Continue monitoring BP regularly.  I spent a total of 30 minutes in both face to face and non face to face activities for this visit on the date of this  encounter. During this time history was obtained and documented, examination was performed, prior labs reviewed, and assessment/plan discussed.  Return if symptoms worsen or fail to improve, for keep next appointment.  Viet Kemmerer G. [place], MD  [facility]. [place] office.

## 2023-09-16 NOTE — Patient Instructions (Addendum)
 A few things to remember from today's visit:  Vaginal bleeding  Essential hypertension, benign  Possible local injury that has healed. If new episode of spotting, please let me know. Continue monitoring blood pressure regularly.  If you need refills for medications you take chronically, please call your pharmacy. Do not use My Chart to request refills or for acute issues that need immediate attention. If you send a my chart message, it may take a few days to be addressed, specially if I am not in the office.  Please be sure medication list is accurate. If a new problem present, please set up appointment sooner than planned today.

## 2023-09-16 NOTE — Assessment & Plan Note (Signed)
 Reporting lower BP readings at home. Continue amlodipine-valsartan 10-320 mg daily and low salt diet. Continue monitoring BP regularly.

## 2023-11-15 DIAGNOSIS — H43392 Other vitreous opacities, left eye: Secondary | ICD-10-CM | POA: Diagnosis not present

## 2023-11-15 DIAGNOSIS — H2513 Age-related nuclear cataract, bilateral: Secondary | ICD-10-CM | POA: Diagnosis not present

## 2023-11-15 DIAGNOSIS — H35033 Hypertensive retinopathy, bilateral: Secondary | ICD-10-CM | POA: Diagnosis not present

## 2023-11-15 DIAGNOSIS — H1045 Other chronic allergic conjunctivitis: Secondary | ICD-10-CM | POA: Diagnosis not present

## 2023-12-04 ENCOUNTER — Ambulatory Visit: Payer: Medicare Other

## 2023-12-04 VITALS — Ht 61.0 in | Wt 165.0 lb

## 2023-12-04 DIAGNOSIS — Z0000 Encounter for general adult medical examination without abnormal findings: Secondary | ICD-10-CM

## 2023-12-04 NOTE — Patient Instructions (Addendum)
 Ms. File , Thank you for taking time out of your busy schedule to complete your Annual Wellness Visit with me. I enjoyed our conversation and look forward to speaking with you again next year. I, as well as your care team,  appreciate your ongoing commitment to your health goals. Please review the following plan we discussed and let me know if I can assist you in the future. Your Game plan/ To Do List    Referrals: If you haven't heard from the office you've been referred to, please reach out to them at the phone provided.   Follow up Visits: Next Medicare AWV with our clinical staff: 12/09/24 @ 8a   Have you seen your provider in the last 6 months (3 months if uncontrolled diabetes)?  Next Office Visit with your provider: 03/11/24 @ 8a  Clinician Recommendations:  Aim for 30 minutes of exercise or brisk walking, 6-8 glasses of water, and 5 servings of fruits and vegetables each day.       This is a list of the screening recommended for you and due dates:  Health Maintenance  Topic Date Due   COVID-19 Vaccine (8 - 2024-25 season) 02/17/2023   Colon Cancer Screening  05/15/2026*   Flu Shot  01/17/2024   Mammogram  01/21/2024   Medicare Annual Wellness Visit  12/03/2024   Pneumococcal Vaccine for age over 45  Completed   DEXA scan (bone density measurement)  Completed   Hepatitis C Screening  Completed   Hepatitis B Vaccine  Aged Out   HPV Vaccine  Aged Out   Meningitis B Vaccine  Aged Out   DTaP/Tdap/Td vaccine  Discontinued   Zoster (Shingles) Vaccine  Discontinued  *Topic was postponed. The date shown is not the original due date.    Advanced directives: (Declined) Advance directive discussed with you today. Even though you declined this today, please call our office should you change your mind, and we can give you the proper paperwork for you to fill out. Advance Care Planning is important because it:  [x]  Makes sure you receive the medical care that is consistent with your  values, goals, and preferences  [x]  It provides guidance to your family and loved ones and reduces their decisional burden about whether or not they are making the right decisions based on your wishes.  Follow the link provided in your after visit summary or read over the paperwork we have mailed to you to help you started getting your Advance Directives in place. If you need assistance in completing these, please reach out to us  so that we can help you!  See attachments for Preventive Care and Fall Prevention Tips.

## 2023-12-04 NOTE — Progress Notes (Addendum)
 Subjective:   Kristen Wolf is a 73 y.o. who presents for a Medicare Wellness preventive visit.  As a reminder, Annual Wellness Visits don't include a physical exam, and some assessments may be limited, especially if this visit is performed virtually. We may recommend an in-person follow-up visit with your provider if needed.  Visit Complete: Virtual I connected with  Kristen Wolf on 12/13/23 by a audio enabled telemedicine application and verified that I am speaking with the correct person using two identifiers.  Patient Location: Home  Provider Location: Home Office  I discussed the limitations of evaluation and management by telemedicine. The patient expressed understanding and agreed to proceed.  Vital Signs: Because this visit was a virtual/telehealth visit, some criteria may be missing or patient reported. Any vitals not documented were not able to be obtained and vitals that have been documented are patient reported.    Persons Participating in Visit: Patient.  AWV Questionnaire: Yes: Patient Medicare AWV questionnaire was completed by the patient on 11/28/23; I have confirmed that all information answered by patient is correct and no changes since this date.  Cardiac Risk Factors include: advanced age (>75men, >81 women);hypertension     Objective:    Today's Vitals   12/04/23 0811  Weight: 165 lb (74.8 kg)  Height: 5' 1 (1.549 m)   Body mass index is 31.18 kg/m.     12/04/2023    8:18 AM 11/30/2022    8:28 AM 11/27/2021    8:29 AM 11/25/2020    8:26 AM 02/05/2018    8:20 AM 01/05/2017    3:35 AM 05/15/2016   12:51 PM  Advanced Directives  Does Patient Have a Medical Advance Directive? No No No Yes No  No  No   Type of Theme park manager;Living will     Copy of Healthcare Power of Attorney in Chart?    No - copy requested     Would patient like information on creating a medical advance directive? No - Patient declined  No - Patient declined No - Patient declined   No - Patient declined       Data saved with a previous flowsheet row definition    Current Medications (verified) Outpatient Encounter Medications as of 12/04/2023  Medication Sig   amLODipine -valsartan  (EXFORGE ) 10-320 MG tablet Take 1 tablet by mouth daily.   Calcium -Magnesium (CAL-MAG PO) Take 600 mg by mouth every morning. 1000 units of vitamin D    fluticasone  (FLONASE ) 50 MCG/ACT nasal spray Place 1 spray into both nostrils daily. (Patient taking differently: Place 1 spray into both nostrils daily as needed.)   KLOR-CON  M20 20 MEQ tablet TAKE 1 TABLET BY MOUTH EVERY DAY   rosuvastatin  (CRESTOR ) 10 MG tablet Take 1 tablet by mouth 3 times per week.   No facility-administered encounter medications on file as of 12/04/2023.    Allergies (verified) Patient has no known allergies.   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   Cyst (solitary) of breast    L breast cyst, mamogram in September unchanged   Gout    Hyperlipidemia    Hypertension    Kidney cysts    Liver cyst    Past Surgical History:  Procedure Laterality Date   ABDOMINAL HYSTERECTOMY     1993  APPENDECTOMY     1980   COLONOSCOPY  03/2011   Hx rectal cancer   PARATHYROIDECTOMY     2005   PARATHYROIDECTOMY     2006   POLYPECTOMY     TUBAL LIGATION     1980   TUBAL LIGATION     Family History  Problem Relation Age of Onset   Asthma Mother    Hypertension Father    Multiple myeloma Sister    Colon cancer Neg Hx    Esophageal cancer Neg Hx    Stomach cancer Neg Hx    Social History   Socioeconomic History   Marital status: Divorced    Spouse name: Not on file   Number of children: Not on file   Years of education: Not on file   Highest education level: Associate degree: occupational, Scientist, product/process development, or vocational program  Occupational History    Not on file  Tobacco Use   Smoking status: Never   Smokeless tobacco: Never  Vaping Use   Vaping status: Never Used  Substance and Sexual Activity   Alcohol use: No   Drug use: No   Sexual activity: Not on file  Other Topics Concern   Not on file  Social History Narrative   Not on file   Social Drivers of Health   Financial Resource Strain: Medium Risk (12/04/2023)   Overall Financial Resource Strain (CARDIA)    Difficulty of Paying Living Expenses: Somewhat hard  Food Insecurity: No Food Insecurity (12/04/2023)   Hunger Vital Sign    Worried About Running Out of Food in the Last Year: Never true    Ran Out of Food in the Last Year: Never true  Transportation Needs: No Transportation Needs (12/04/2023)   PRAPARE - Administrator, Civil Service (Medical): No    Lack of Transportation (Non-Medical): No  Physical Activity: Sufficiently Active (12/04/2023)   Exercise Vital Sign    Days of Exercise per Week: 4 days    Minutes of Exercise per Session: 80 min  Stress: No Stress Concern Present (12/04/2023)   [employer] of Occupational Health - Occupational Stress Questionnaire    Feeling of Stress: Only a little  Social Connections: Moderately Integrated (12/04/2023)   Social Connection and Isolation Panel    Frequency of Communication with Friends and Family: More than three times a week    Frequency of Social Gatherings with Friends and Family: Three times a week    Attends Religious Services: More than 4 times per year    Active Member of Clubs or Organizations: Yes    Attends Engineer, structural: More than 4 times per year    Marital Status: Divorced    Tobacco Counseling Counseling given: Not Answered    Clinical Intake:  Pre-visit preparation completed: Yes  Pain : No/denies pain     BMI - recorded: 31.18 Nutritional Status: BMI > 30  Obese Nutritional Risks: None Diabetes: No  No results found for: HGBA1C   How often do you  need to have someone help you when you read instructions, pamphlets, or other written materials from your doctor or pharmacy?: 1 - Never  Interpreter Needed?: No  Information entered by :: Rojelio Blush LPN   Activities of Daily Living     12/04/2023    8:16 AM 11/28/2023    2:02 PM  In your present state of health, do you have any difficulty performing the following activities:  Hearing? 0 0  Vision? 0 0  Difficulty concentrating or making decisions? 0 0  Walking or climbing stairs? 0 0  Dressing or bathing? 0 0  Doing errands, shopping? 0 0  Preparing Food and eating ? N N  Using the Toilet? N N  In the past six months, have you accidently leaked urine? N N  Do you have problems with loss of bowel control? N N  Managing your Medications? N N  Managing your Finances? N N  Housekeeping or managing your Housekeeping? N N    Patient Care Team: [place], Betty G, MD as PCP - General (Family Medicine) Liane Sharyne MATSU, [facility] (Inactive) as Pharmacist (Pharmacist)  I have updated your Care Teams any recent Medical Services you may have received from other providers in the past year.     Assessment:   This is a routine wellness examination for Z[place].  Hearing/Vision screen Hearing Screening - Comments:: Denies hearing difficulties   Vision Screening - Comments:: Wears rx glasses - up to date with routine eye exams with  [facility]   Goals Addressed               This Visit's Progress     Increase physical activity (pt-stated)        Continue walking daily.       Depression Screen     12/04/2023    8:14 AM 09/09/2023    7:58 AM 03/11/2023    8:53 AM 11/30/2022    8:25 AM 09/07/2022    8:52 AM 03/13/2022    8:52 AM 11/27/2021    8:22 AM  PHQ 2/9 Scores  PHQ - 2 Score 0 0 0 0 0 0 0  PHQ- 9 Score   2        Fall Risk     12/04/2023    8:17 AM 11/28/2023    2:02 PM 03/11/2023    8:52 AM 03/07/2023    1:23 PM 11/30/2022    8:27 AM  Fall Risk   Falls in the past  year? 0 0 0 0 0  Number falls in past yr: 0 0 0  0  Injury with Fall? 0 0 0  0  Risk for fall due to : No Fall Risks  Other (Comment)  No Fall Risks  Follow up Falls evaluation completed  Falls evaluation completed  Falls prevention discussed    MEDICARE RISK AT HOME:  Medicare Risk at Home Any stairs in or around the home?: Yes If so, are there any without handrails?: No Home free of loose throw rugs in walkways, pet beds, electrical cords, etc?: Yes Adequate lighting in your home to reduce risk of falls?: Yes Life alert?: No Use of a cane, walker or w/c?: No Grab bars in the bathroom?: Yes Shower chair or bench in shower?: No Elevated toilet seat or a handicapped toilet?: No  TIMED UP AND GO:  Was the test performed?  No  Cognitive Function: 6CIT completed        12/04/2023    8:18 AM 11/30/2022    8:28 AM 11/27/2021    8:29 AM  6CIT Screen  What Year? 0 points 0 points 0 points  What month? 0 points 0 points 0 points  What time? 0 points 0 points 0 points  Count back from 20 0 points 0 points 0 points  Months in reverse 0 points 0 points 0 points  Repeat phrase 4 points 0 points 0 points  Total Score 4 points 0 points 0 points  Immunizations Immunization History  Administered Date(s) Administered   Fluad Quad(high Dose 65+) 02/29/2020, 02/08/2022   Fluad Trivalent(High Dose 65+) 03/11/2023   Influenza, High Dose Seasonal PF 04/22/2018, 04/14/2019, 03/20/2021   Moderna Covid-19 Fall Seasonal Vaccine 50yrs & older 07/09/2022   PFIZER Comirnaty(Gray Top)Covid-19 Tri-Sucrose Vaccine 01/18/2021   PFIZER(Purple Top)SARS-COV-2 Vaccination 08/09/2019, 09/02/2019, 04/07/2020, 06/05/2021   PNEUMOCOCCAL CONJUGATE-20 09/21/2020   Pfizer Covid-19 Vaccine Bivalent Booster 6yrs & up 06/05/2021   Pneumococcal Conjugate-13 08/16/2014   Pneumococcal Polysaccharide-23 05/01/2017   Tdap 01/15/2007    Screening Tests Health Maintenance  Topic Date Due   COVID-19 Vaccine (8 -  2024-25 season) 02/17/2023   Colonoscopy  05/15/2026 (Originally 05/15/2021)   INFLUENZA VACCINE  01/17/2024   MAMMOGRAM  01/21/2024   Medicare Annual Wellness (AWV)  12/03/2024   Pneumococcal Vaccine: 50+ Years  Completed   DEXA SCAN  Completed   Hepatitis C Screening  Completed   Hepatitis B Vaccines  Aged Out   HPV VACCINES  Aged Out   Meningococcal B Vaccine  Aged Out   DTaP/Tdap/Td  Discontinued   Zoster Vaccines- Shingrix  Discontinued    Health Maintenance  Health Maintenance Due  Topic Date Due   COVID-19 Vaccine (8 - 2024-25 season) 02/17/2023   Health Maintenance Items Addressed:   Additional Screening:  Vision Screening: Recommended annual ophthalmology exams for early detection of glaucoma and other disorders of the eye. Would you like a referral to an eye doctor? No    Dental Screening: Recommended annual dental exams for proper oral hygiene  Community Resource Referral / Chronic Care Management: CRR required this visit?  No   CCM required this visit?  No   Plan:    I have personally reviewed and noted the following in the patient's chart:   Medical and social history Use of alcohol, tobacco or illicit drugs  Current medications and supplements including opioid prescriptions. Patient is not currently taking opioid prescriptions. Functional ability and status Nutritional status Physical activity Advanced directives List of other physicians Hospitalizations, surgeries, and ER visits in previous 12 months Vitals Screenings to include cognitive, depression, and falls Referrals and appointments  In addition, I have reviewed and discussed with patient certain preventive protocols, quality metrics, and best practice recommendations. A written personalized care plan for preventive services as well as general preventive health recommendations were provided to patient.   Rojelio LELON Blush, LPN   [id number]/72/[id number]   After Visit Summary: (MyChart) Due to this being  a telephonic visit, the after visit summary with patients personalized plan was offered to patient via MyChart   Notes: Nothing significant to report at this time.

## 2023-12-10 ENCOUNTER — Other Ambulatory Visit: Payer: Self-pay | Admitting: Family Medicine

## 2023-12-10 DIAGNOSIS — Z1231 Encounter for screening mammogram for malignant neoplasm of breast: Secondary | ICD-10-CM

## 2024-01-22 ENCOUNTER — Ambulatory Visit
Admission: RE | Admit: 2024-01-22 | Discharge: 2024-01-22 | Disposition: A | Discharge status: Home / Self Care | Source: Ambulatory Visit | Attending: Family Medicine | Admitting: Family Medicine

## 2024-01-22 DIAGNOSIS — Z1231 Encounter for screening mammogram for malignant neoplasm of breast: Secondary | ICD-10-CM

## 2024-03-10 NOTE — Progress Notes (Unsigned)
 HPI:  No chief complaint on file.   Kristen Wolf is a 73 y.o. female, who is here today for chronic disease management. Last seen on ***  Hypertension:  Medications:*** BP readings at home:*** Side effects:***  Negative for unusual or severe headache, visual changes, exertional chest pain, dyspnea,  focal weakness, or edema.  Lab Results  Component Value Date   NA 138 09/09/2023   CL 103 09/09/2023   K 3.9 09/09/2023   CO2 27 09/09/2023   BUN 21 09/09/2023   CREATININE 1.06 09/09/2023   GFR 51.86 (L) 09/09/2023   CALCIUM  10.0 09/09/2023   ALBUMIN 4.2 09/09/2023   GLUCOSE 96 09/09/2023   Lab Results  Component Value Date   MICROALBUR <0.7 09/09/2023   Lab Results  Component Value Date   VD25OH 73.57 09/07/2022    Review of Systems See other pertinent positives and negatives in HPI.  Current Outpatient Medications on File Prior to Visit  Medication Sig Dispense Refill   amLODipine -valsartan  (EXFORGE ) 10-320 MG tablet Take 1 tablet by mouth daily. 90 tablet 2   Calcium -Magnesium (CAL-MAG PO) Take 600 mg by mouth every morning. 1000 units of vitamin D      fluticasone  (FLONASE ) 50 MCG/ACT nasal spray Place 1 spray into both nostrils daily. (Patient taking differently: Place 1 spray into both nostrils daily as needed.) 16 g 6   KLOR-CON  M20 20 MEQ tablet TAKE 1 TABLET BY MOUTH EVERY DAY 90 tablet 2   rosuvastatin  (CRESTOR ) 10 MG tablet Take 1 tablet by mouth 3 times per week. 30 tablet 3   No current facility-administered medications on file prior to visit.    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   Cyst (solitary) of breast    L breast cyst, mamogram in September unchanged   Gout    Hyperlipidemia    Hypertension    Kidney cysts    Liver cyst     No Known Allergies  Social History   Socioeconomic History   Marital status: Divorced     Spouse name: Not on file   Number of children: Not on file   Years of education: Not on file   Highest education level: Associate degree: occupational, Scientist, product/process development, or vocational program  Occupational History   Not on file  Tobacco Use   Smoking status: Never   Smokeless tobacco: Never  Vaping Use   Vaping status: Never Used  Substance and Sexual Activity   Alcohol use: No   Drug use: No   Sexual activity: Not on file  Other Topics Concern   Not on file  Social History Narrative   Not on file   Social Drivers of Health   Financial Resource Strain: Medium Risk (12/04/2023)   Overall Financial Resource Strain (CARDIA)    Difficulty of Paying Living Expenses: Somewhat hard  Food Insecurity: No Food Insecurity (12/04/2023)   Hunger Vital Sign    Worried About Running Out of Food in the Last Year: Never true    Ran Out of Food in the Last Year: Never true  Transportation Needs: No Transportation Needs (12/04/2023)   PRAPARE - Administrator, Civil Service (Medical): No    Lack of Transportation (Non-Medical): No  Physical Activity: Sufficiently Active (12/04/2023)   Exercise Vital Sign    Days of Exercise per  Week: 4 days    Minutes of Exercise per Session: 80 min  Stress: No Stress Concern Present (12/04/2023)   [employer] of Occupational Health - Occupational Stress Questionnaire    Feeling of Stress: Only a little  Social Connections: Moderately Integrated (12/04/2023)   Social Connection and Isolation Panel    Frequency of Communication with Friends and Family: More than three times a week    Frequency of Social Gatherings with Friends and Family: Three times a week    Attends Religious Services: More than 4 times per year    Active Member of Clubs or Organizations: Yes    Attends Banker Meetings: More than 4 times per year    Marital Status: Divorced    There were no vitals filed for this visit.  There is no height or weight on file to  calculate BMI.  Physical Exam  ASSESSMENT AND PLAN:  Diagnoses and all orders for this visit:  Stage 3a chronic kidney disease (HCC)  Essential hypertension, benign    No orders of the defined types were placed in this encounter.   No problem-specific Assessment & Plan notes found for this encounter.   No follow-ups on file.   Tava Peery [place], MD [facility]. [place] office.

## 2024-03-11 ENCOUNTER — Encounter: Payer: Self-pay | Admitting: Family Medicine

## 2024-03-11 ENCOUNTER — Ambulatory Visit: Payer: Self-pay | Admitting: Family Medicine

## 2024-03-11 ENCOUNTER — Ambulatory Visit: Admitting: Family Medicine

## 2024-03-11 VITALS — BP 140/78 | HR 87 | Temp 98.1°F | Resp 16 | Ht 61.0 in | Wt 166.8 lb

## 2024-03-11 DIAGNOSIS — N1831 Chronic kidney disease, stage 3a: Secondary | ICD-10-CM | POA: Diagnosis not present

## 2024-03-11 DIAGNOSIS — I10 Essential (primary) hypertension: Secondary | ICD-10-CM | POA: Diagnosis not present

## 2024-03-11 DIAGNOSIS — E876 Hypokalemia: Secondary | ICD-10-CM | POA: Diagnosis not present

## 2024-03-11 DIAGNOSIS — Z23 Encounter for immunization: Secondary | ICD-10-CM

## 2024-03-11 LAB — VITAMIN D 25 HYDROXY (VIT D DEFICIENCY, FRACTURES): VITD: 85.78 ng/mL (ref 30.00–100.00)

## 2024-03-11 LAB — BASIC METABOLIC PANEL WITH GFR
BUN: 13 mg/dL (ref 6–23)
CO2: 26 meq/L (ref 19–32)
Calcium: 10.1 mg/dL (ref 8.4–10.5)
Chloride: 101 meq/L (ref 96–112)
Creatinine, Ser: 1 mg/dL (ref 0.40–1.20)
GFR: 55.42 mL/min — ABNORMAL LOW (ref >60.00)
Glucose, Bld: 107 mg/dL — ABNORMAL HIGH (ref 70–99)
Potassium: 3.4 meq/L — ABNORMAL LOW (ref 3.5–5.1)
Sodium: 138 meq/L (ref 135–145)

## 2024-03-11 MED ORDER — POTASSIUM CHLORIDE CRYS ER 20 MEQ PO TBCR: 20.0000 meq | EXTENDED_RELEASE_TABLET | Freq: Every day | ORAL | 2 refills | Status: AC

## 2024-03-11 NOTE — Assessment & Plan Note (Signed)
 BP mildly elevated today, improved after a few minutes.  She is reporting lower BP readings at home, so for now no changes in current management. Currently she is on amlodipine -valsartan  10-320 mg daily. Continue low-salt diet. Recommend monitoring BP at least twice per month. Eye exam is current. Follow-up in 6 months, before if needed.

## 2024-03-11 NOTE — Assessment & Plan Note (Addendum)
 Problem has been stable, Cr 1.0-1.1 and e GFR high 40's-50.  Creatinine 1.06 and eGFR 51 in 08/2023. Continue adequate hydration, low-salt diet, and avoidance of NSAIDs. Stressed the importance of adequate BP control.

## 2024-03-11 NOTE — Assessment & Plan Note (Signed)
 Currently she is not on potassium supplementation. Renin and aldosterone in normal range in 03/2023. Further recommendation will be given according to BMP result.

## 2024-03-11 NOTE — Patient Instructions (Addendum)
 A few things to remember from today's visit:  Stage 3a chronic kidney disease (HCC) - Plan: Basic metabolic panel with GFR, VITAMIN D  25 Hydroxy (Vit-D Deficiency, Fractures)  Essential hypertension, benign - Plan: Basic metabolic panel with GFR  No changes today. Monitor blood pressure 2 times per months, ideally it should be 130/80 or less.  If you need refills for medications you take chronically, please call your pharmacy. Do not use My Chart to request refills or for acute issues that need immediate attention. If you send a my chart message, it may take a few days to be addressed, specially if I am not in the office.  Please be sure medication list is accurate. If a new problem present, please set up appointment sooner than planned today.

## 2024-04-22 ENCOUNTER — Other Ambulatory Visit: Payer: Self-pay

## 2024-04-22 ENCOUNTER — Emergency Department (HOSPITAL_COMMUNITY)

## 2024-04-22 ENCOUNTER — Encounter (HOSPITAL_COMMUNITY): Payer: Self-pay

## 2024-04-22 ENCOUNTER — Emergency Department (HOSPITAL_COMMUNITY)
Admission: EM | Admit: 2024-04-22 | Discharge: 2024-04-22 | Disposition: A | Discharge status: Home / Self Care | Attending: Emergency Medicine | Admitting: Emergency Medicine

## 2024-04-22 DIAGNOSIS — Y92410 Unspecified street and highway as the place of occurrence of the external cause: Secondary | ICD-10-CM | POA: Insufficient documentation

## 2024-04-22 DIAGNOSIS — Z79899 Other long term (current) drug therapy: Secondary | ICD-10-CM | POA: Diagnosis not present

## 2024-04-22 DIAGNOSIS — T148XXA Other injury of unspecified body region, initial encounter: Secondary | ICD-10-CM | POA: Diagnosis not present

## 2024-04-22 DIAGNOSIS — I129 Hypertensive chronic kidney disease with stage 1 through stage 4 chronic kidney disease, or unspecified chronic kidney disease: Secondary | ICD-10-CM | POA: Insufficient documentation

## 2024-04-22 DIAGNOSIS — N183 Chronic kidney disease, stage 3 unspecified: Secondary | ICD-10-CM | POA: Diagnosis not present

## 2024-04-22 DIAGNOSIS — R0781 Pleurodynia: Secondary | ICD-10-CM | POA: Insufficient documentation

## 2024-04-22 MED ORDER — METHOCARBAMOL 500 MG PO TABS: 500.0000 mg | ORAL_TABLET | Freq: Two times a day (BID) | ORAL | 0 refills | Status: AC
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
## 2024-05-04 ENCOUNTER — Encounter: Payer: Self-pay | Admitting: Family Medicine

## 2024-05-04 ENCOUNTER — Ambulatory Visit (INDEPENDENT_AMBULATORY_CARE_PROVIDER_SITE_OTHER): Admitting: Family Medicine

## 2024-05-04 VITALS — BP 155/80 | HR 113 | Temp 98.4°F | Resp 16 | Ht 61.0 in | Wt 166.4 lb

## 2024-05-04 DIAGNOSIS — I10 Essential (primary) hypertension: Secondary | ICD-10-CM

## 2024-05-04 DIAGNOSIS — M7918 Myalgia, other site: Secondary | ICD-10-CM
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
## 2024-05-04 NOTE — Patient Instructions (Addendum)
 A few things to remember from today's visit:  Essential hypertension, benign  Musculoskeletal pain Monitor blood pressure at home. Tylenol 500 mg 3-4 times per day as needed. Topical icy hot may also help.  If you need refills for medications you take chronically, please call your pharmacy. Do not use My Chart to request refills or for acute issues that need immediate attention. If you send a my chart message, it may take a few days to be addressed, specially if I am not in the office.  Please be sure medication list is accurate. If a new problem present, please set up appointment sooner than planned today.
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
## 2024-05-20 ENCOUNTER — Other Ambulatory Visit: Payer: Self-pay | Admitting: Family Medicine

## 2024-05-20 DIAGNOSIS — I10 Essential (primary) hypertension: Secondary | ICD-10-CM

## 2024-06-24 ENCOUNTER — Encounter: Payer: Self-pay | Admitting: Family Medicine

## 2024-06-24 ENCOUNTER — Ambulatory Visit (INDEPENDENT_AMBULATORY_CARE_PROVIDER_SITE_OTHER): Admitting: Family Medicine

## 2024-06-24 ENCOUNTER — Ambulatory Visit: Attending: Family Medicine | Admitting: Physical Therapy

## 2024-06-24 ENCOUNTER — Encounter: Payer: Self-pay | Admitting: Physical Therapy

## 2024-06-24 ENCOUNTER — Ambulatory Visit

## 2024-06-24 VITALS — BP 140/80 | HR 99 | Temp 97.7°F | Resp 16 | Ht 61.0 in | Wt 165.2 lb

## 2024-06-24 DIAGNOSIS — I10 Essential (primary) hypertension: Secondary | ICD-10-CM | POA: Diagnosis not present

## 2024-06-24 DIAGNOSIS — M6281 Muscle weakness (generalized): Secondary | ICD-10-CM | POA: Diagnosis not present

## 2024-06-24 DIAGNOSIS — M7712 Lateral epicondylitis, left elbow: Secondary | ICD-10-CM | POA: Diagnosis not present

## 2024-06-24 DIAGNOSIS — M25512 Pain in left shoulder: Secondary | ICD-10-CM | POA: Insufficient documentation

## 2024-06-24 DIAGNOSIS — M25522 Pain in left elbow: Secondary | ICD-10-CM | POA: Diagnosis not present

## 2024-06-24 NOTE — Patient Instructions (Addendum)
 A few things to remember from today's visit:  Essential hypertension, benign  Left shoulder pain, unspecified chronicity - Plan: DG Shoulder Left, Ambulatory referral to Physical Therapy  Epicondylitis, lateral, left  Continue monitoring blood pressure. PT will be arranged.  If you need refills for medications you take chronically, please call your pharmacy. Do not use My Chart to request refills or for acute issues that need immediate attention. If you send a my chart message, it may take a few days to be addressed, specially if I am not in the office.  Please be sure medication list is accurate. If a new problem present, please set up appointment sooner than planned today.
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
## 2024-07-06 ENCOUNTER — Ambulatory Visit: Payer: Self-pay | Admitting: Family Medicine

## 2024-07-09 ENCOUNTER — Encounter: Payer: Self-pay | Admitting: Physical Therapy

## 2024-07-09 ENCOUNTER — Ambulatory Visit: Admitting: Physical Therapy

## 2024-07-09 DIAGNOSIS — M25512 Pain in left shoulder: Secondary | ICD-10-CM

## 2024-07-09 DIAGNOSIS — M6281 Muscle weakness (generalized): Secondary | ICD-10-CM

## 2024-07-09 DIAGNOSIS — M25522 Pain in left elbow: Secondary | ICD-10-CM
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
## 2024-07-22 ENCOUNTER — Ambulatory Visit: Admitting: Physical Therapy

## 2024-09-08 ENCOUNTER — Ambulatory Visit: Admitting: Family Medicine

## 2024-12-09 ENCOUNTER — Ambulatory Visit
